# Patient Record
Sex: Female | Born: 1960 | Race: White | Hispanic: No | Marital: Married | Smoking: Never smoker
Health system: Southern US, Community
[De-identification: ages and names within clinical notes are randomized; demographics above are authoritative.]

## PROBLEM LIST (undated history)

## (undated) DIAGNOSIS — E785 Hyperlipidemia, unspecified: Secondary | ICD-10-CM

## (undated) DIAGNOSIS — I10 Essential (primary) hypertension: Secondary | ICD-10-CM

## (undated) HISTORY — PX: BREAST SURGERY: SHX581

## (undated) HISTORY — PX: BREAST BIOPSY: SHX20

## (undated) HISTORY — PX: CHOLECYSTECTOMY: SHX55

## (undated) HISTORY — DX: Essential (primary) hypertension: I10

## (undated) HISTORY — DX: Hyperlipidemia, unspecified: E78.5

---

## 2006-10-01 ENCOUNTER — Ambulatory Visit: Payer: Self-pay | Admitting: Family Medicine

## 2007-08-25 ENCOUNTER — Ambulatory Visit: Payer: Self-pay | Admitting: Family Medicine

## 2007-10-12 ENCOUNTER — Ambulatory Visit: Payer: Self-pay | Admitting: Family Medicine

## 2008-10-18 ENCOUNTER — Ambulatory Visit: Payer: Self-pay | Admitting: Family Medicine

## 2008-11-24 IMAGING — US US PELV - US TRANSVAGINAL
1 series · 14 of 25 positions shown · non-contrast
Comparison: none

REASON FOR EXAM: Immobile uterus
COMMENTS:

[Series 1: us pelv - us transvaginal · 0.37mm/px · 14 of 39 slices shown]
[im 1/39]
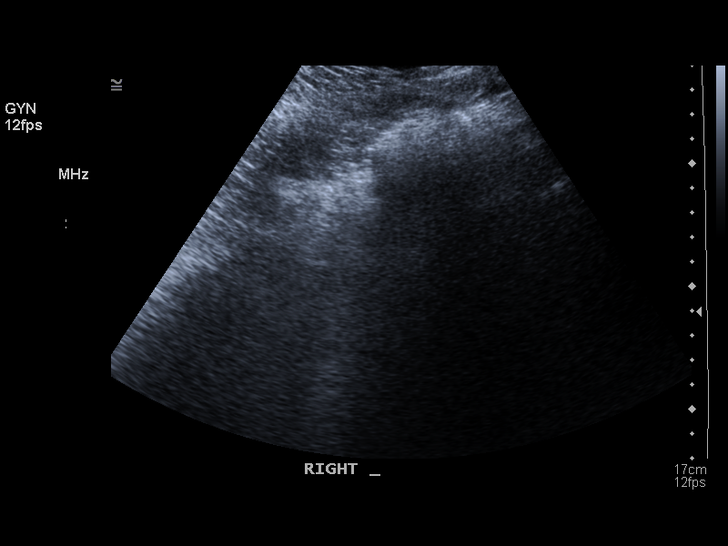
[im 4/39]
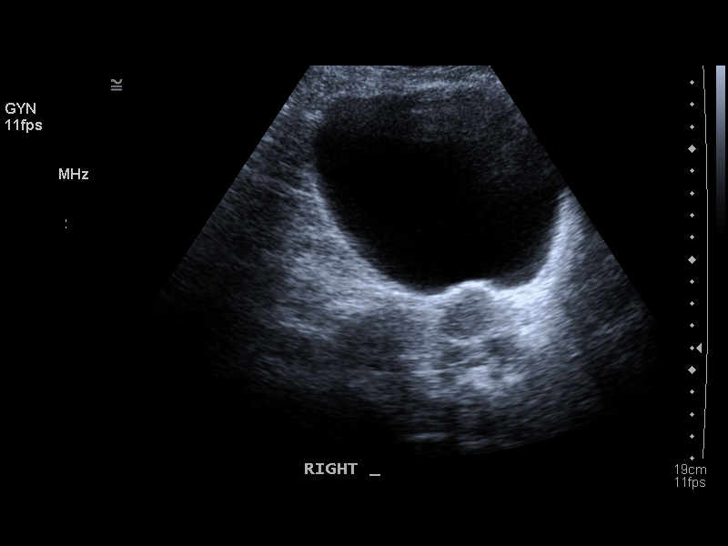
[im 7/39]
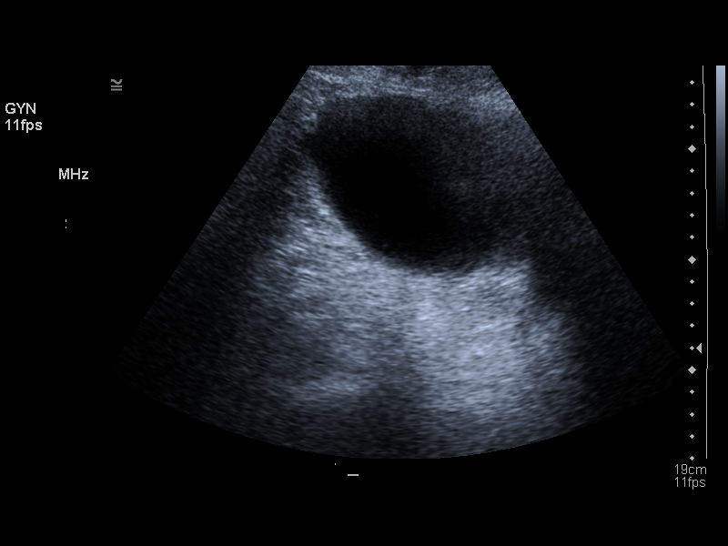
[im 10/39]
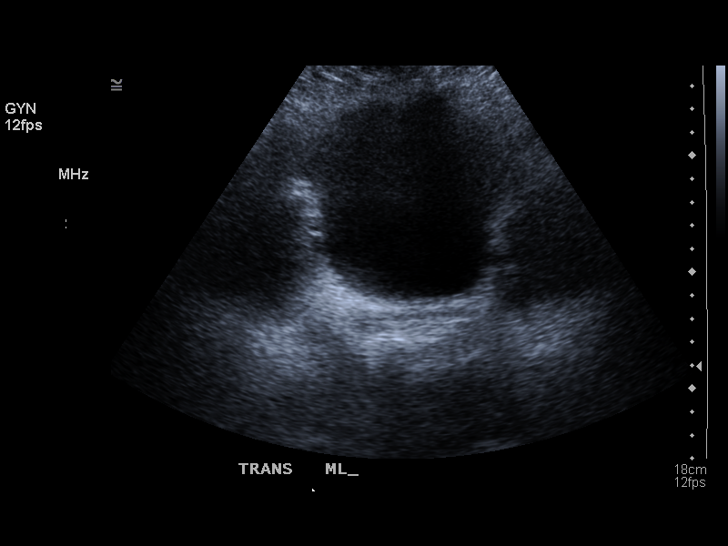
[im 13/39]
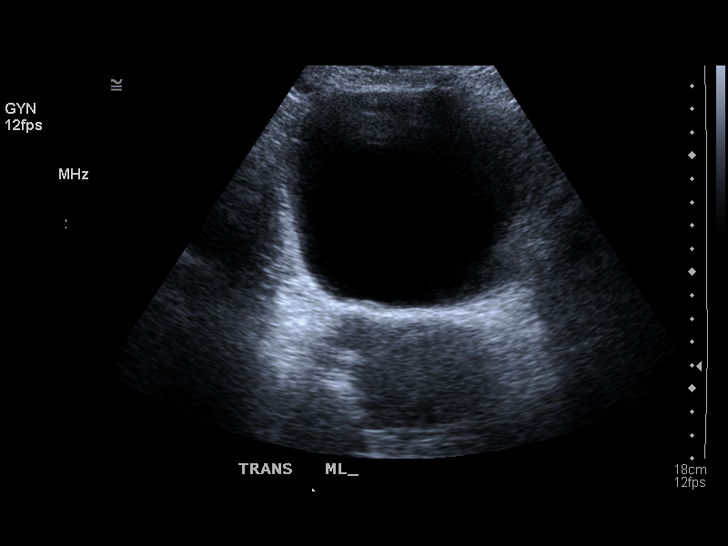
[im 15/39]
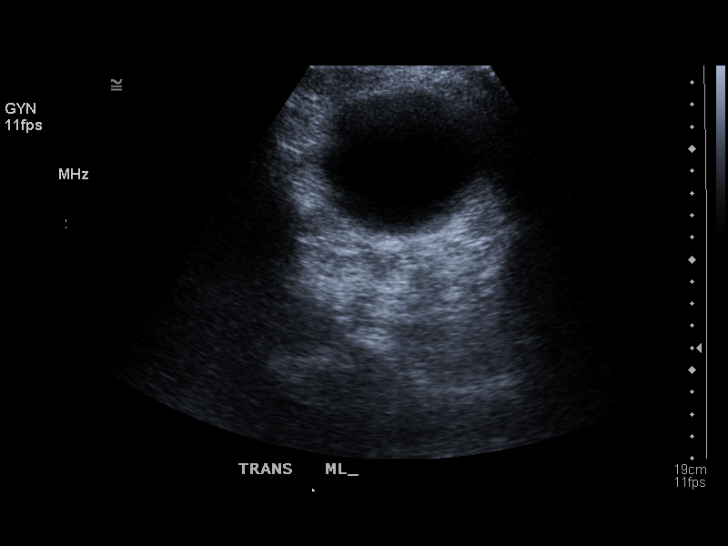
[im 18/39]
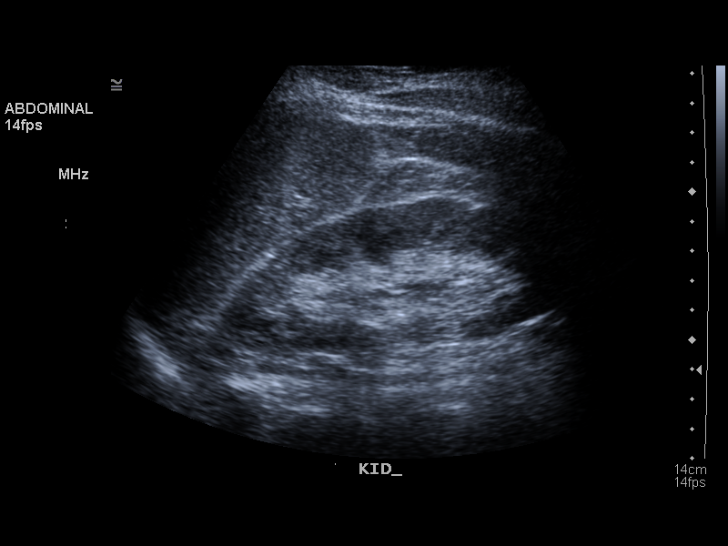
[im 21/39]
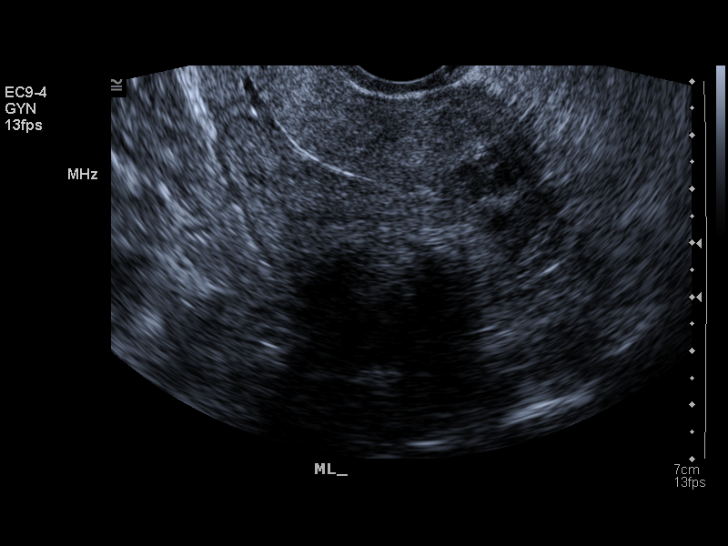
[im 24/39]
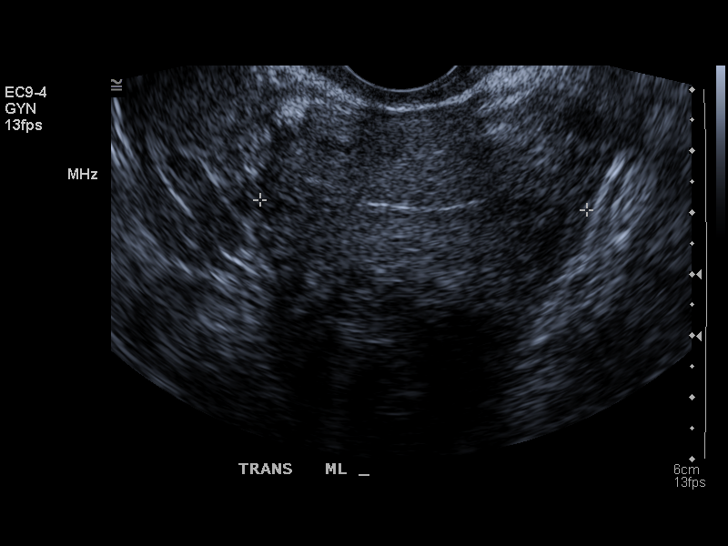
[im 26/39]
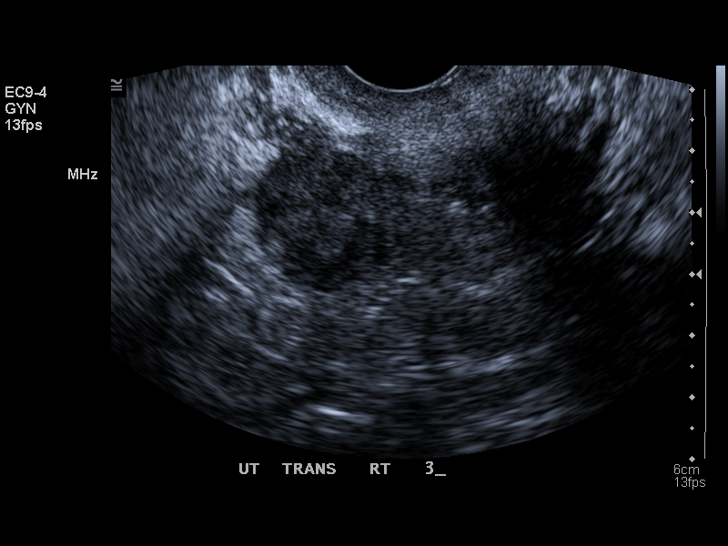
[im 29/39]
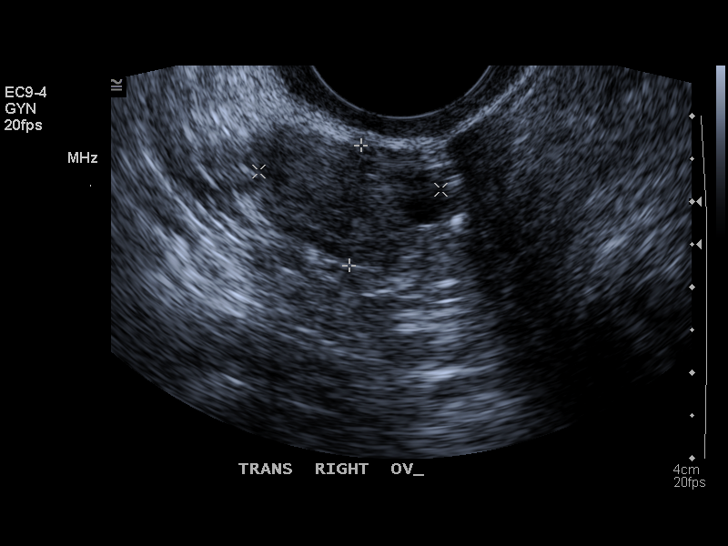
[im 32/39]
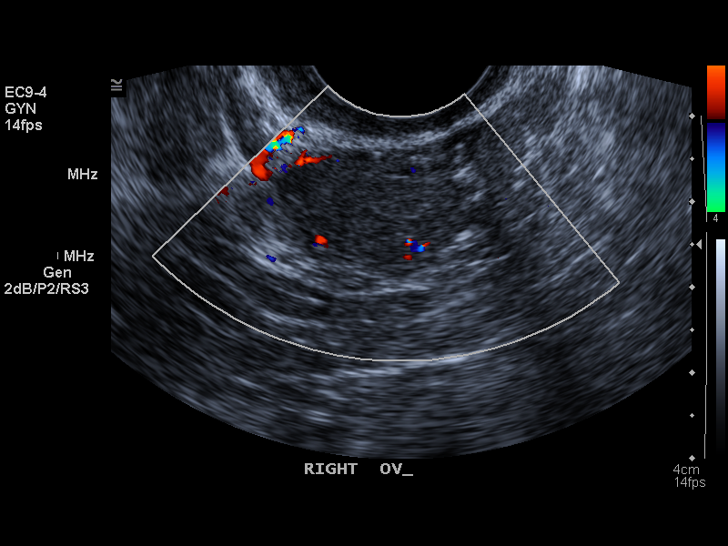
[im 35/39]
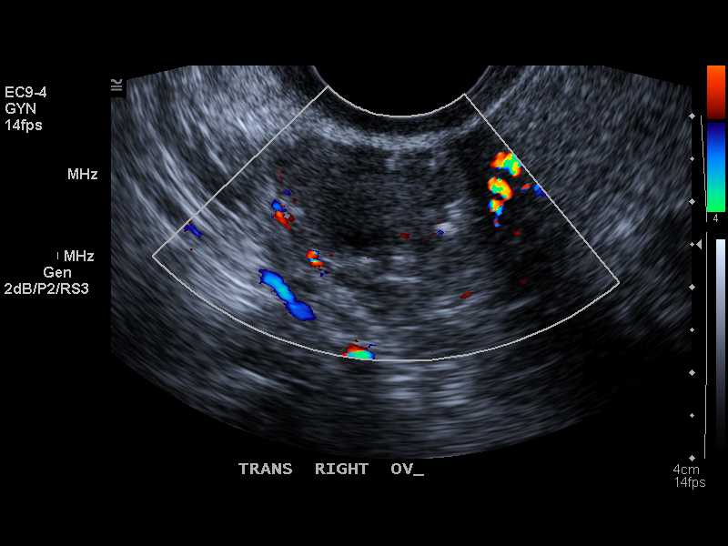
[im 39/39]
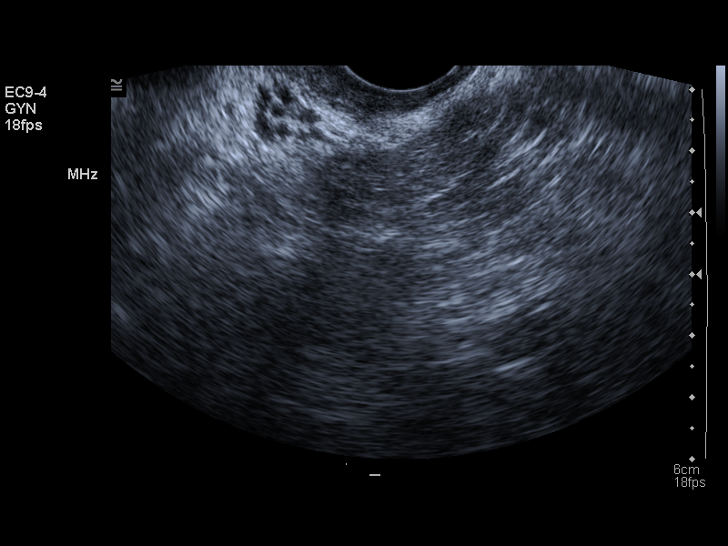

[14 of 25 positions shown; findings below may reference images not displayed]

PROCEDURE:     US  - US PELVIS MASS EXAM  - [DATE]  [DATE] [DATE]  [DATE]

RESULT:     The uterus measures 8.64 x 4.77 x 5.31 cm. Three, heterogeneous
masses are identified within the uterus with the largest measuring 4.08 x
4.92 x 3.61 cm indicative of leiomyomata.

Endometrial thickness is 2.5 mm. There is no evidence of pelvic free fluid
or drainable loculated fluid collections. The LEFT ovary is not visualized.
The RIGHT ovary measures 2.86 x 1.41 x 2.13 cm. A small follicle versus cyst
is demonstrated within the periphery of the RIGHT ovary. Small focal
punctate echogenic foci are demonstrated within the RIGHT ovary possibly
representing small calcifications. No further pelvic abnormalities are
appreciated.
IMPRESSION: 1.  Likely cysts as well as possible small calcifications within the RIGHT
ovary.
2.  Leiomyomata involving the uterus.

## 2009-10-19 ENCOUNTER — Ambulatory Visit: Payer: Self-pay | Admitting: Family Medicine

## 2022-09-17 ENCOUNTER — Other Ambulatory Visit: Payer: Self-pay | Admitting: Internal Medicine

## 2022-09-17 ENCOUNTER — Ambulatory Visit: Payer: BC Managed Care – PPO | Admitting: Internal Medicine

## 2022-09-17 ENCOUNTER — Encounter: Payer: Self-pay | Admitting: Internal Medicine

## 2022-09-17 VITALS — BP 120/78 | HR 96 | Temp 98.5°F | Resp 18 | Ht 65.0 in | Wt 181.5 lb

## 2022-09-17 DIAGNOSIS — I10 Essential (primary) hypertension: Secondary | ICD-10-CM | POA: Diagnosis not present

## 2022-09-17 DIAGNOSIS — Z1231 Encounter for screening mammogram for malignant neoplasm of breast: Secondary | ICD-10-CM

## 2022-09-17 DIAGNOSIS — E782 Mixed hyperlipidemia: Secondary | ICD-10-CM | POA: Diagnosis not present

## 2022-09-17 DIAGNOSIS — N952 Postmenopausal atrophic vaginitis: Secondary | ICD-10-CM

## 2022-09-17 DIAGNOSIS — M62838 Other muscle spasm: Secondary | ICD-10-CM | POA: Diagnosis not present

## 2022-09-17 MED ORDER — LISINOPRIL-HYDROCHLOROTHIAZIDE 20-25 MG PO TABS: 1.0000 | ORAL_TABLET | Freq: Every day | ORAL | 1 refills | Status: DC

## 2022-09-17 MED ORDER — METHOCARBAMOL 750 MG PO TABS: 750.0000 mg | ORAL_TABLET | Freq: Three times a day (TID) | ORAL | 1 refills | Status: DC | PRN

## 2022-09-17 MED ORDER — ESTROGENS CONJUGATED 0.625 MG/GM VA CREA: 1.0000 | TOPICAL_CREAM | Freq: Every day | VAGINAL | 0 refills | Status: DC

## 2022-09-17 NOTE — Progress Notes (Signed)
New Patient Office Visit  Subjective    Patient ID: April Murphy, female    DOB: 28-Dec-1960  Age: 61 y.o. MRN: [id number]  CC:  Chief Complaint  Patient presents with   Establish Care    HPI April Murphy presents to establish care.  She recently moved here from [place] to be closer to family.  Hypertension: -Medications: Lisinopril-HCTZ 20-25 mg, has been on for a few years  -Patient is compliant with above medications and reports no side effects. -Checking BP at home (average): doesn't check  -Denies any SOB, CP, vision changes, LE edema or symptoms of hypotension  HLD: -Medications: Zocor 10 mg -Patient is compliant with above medications and reports no side effects.  -Last lipid panel: last checked in 6/23 in [facility]  She is also complaining of some vaginal dryness today.  She states she has tried multiple lubrications but sexual intercourse is still very painful for her.  This has been an ongoing issue since she went through menopause a few years ago.  Health Maintenance: -Blood work in [facility] in 6/23 -Mammogram in [facility] 3/23 -Colonoscopy due in 2025  -Pap due   Outpatient Encounter Medications as of 09/17/2022  Medication Sig   conjugated estrogens (PREMARIN) vaginal cream Place 1 Applicatorful vaginally daily.   meloxicam (MOBIC) 7.5 MG tablet Take 7.5 mg by mouth daily.   simvastatin (ZOCOR) 10 MG tablet Take 10 mg by mouth at bedtime.   [DISCONTINUED] lisinopril-hydrochlorothiazide (ZESTORETIC) 20-25 MG tablet Take 1 tablet by mouth at bedtime.   [DISCONTINUED] methocarbamol (ROBAXIN) 750 MG tablet Take 750 mg by mouth every 8 (eight) hours as needed.   lisinopril-hydrochlorothiazide (ZESTORETIC) 20-25 MG tablet Take 1 tablet by mouth at bedtime.   methocarbamol (ROBAXIN) 750 MG tablet Take 1 tablet (750 mg total) by mouth every 8 (eight) hours as needed.   No facility-administered encounter medications on file as of 09/17/2022.    Past  Medical History:  Diagnosis Date   Hyperlipidemia    Hypertension     Past Surgical History:  Procedure Laterality Date   BREAST SURGERY     CESAREAN SECTION     CHOLECYSTECTOMY      Family History  Problem Relation Age of Onset   Hypertension Father    Hypertension Sister    Hyperlipidemia Sister    Hypertension Brother    Hypertension Son     Social History   Socioeconomic History   Marital status: Married    Spouse name: Not on file   Number of children: Not on file   Years of education: Not on file   Highest education level: Not on file  Occupational History   Not on file  Tobacco Use   Smoking status: Never   Smokeless tobacco: Never  Vaping Use   Vaping Use: Never used  Substance and Sexual Activity   Alcohol use: Never   Drug use: Not on file   Sexual activity: Yes  Other Topics Concern   Not on file  Social History Narrative   Not on file   Social Determinants of Health   Financial Resource Strain: Not on file  Food Insecurity: Not on file  Transportation Needs: Not on file  Physical Activity: Not on file  Stress: Not on file  Social Connections: Not on file  Intimate Partner Violence: Not on file    Review of Systems  Constitutional:  Negative for chills and fever.  Eyes:  Negative for blurred vision.  Respiratory:  Negative for shortness of breath.   Cardiovascular:  Negative for chest pain.  Neurological:  Negative for headaches.  All other systems reviewed and are negative.      Objective    BP 120/78   Pulse 96   Temp 98.5 F (36.9 C)   Resp 18   Ht 5\' 5"  (1.651 m)   Wt 181 lb 8 oz (82.3 kg)   SpO2 97%   BMI 30.20 kg/m   Physical Exam Constitutional:      Appearance: Normal appearance.  HENT:     Head: Normocephalic and atraumatic.     Mouth/Throat:     Mouth: Mucous membranes are moist.     Pharynx: Oropharynx is clear.  Eyes:     Extraocular Movements: Extraocular movements intact.     Conjunctiva/sclera:  Conjunctivae normal.     Pupils: Pupils are equal, round, and reactive to light.  Cardiovascular:     Rate and Rhythm: Normal rate and regular rhythm.  Pulmonary:     Effort: Pulmonary effort is normal.     Breath sounds: Normal breath sounds.  Musculoskeletal:     Right lower leg: No edema.     Left lower leg: No edema.  Lymphadenopathy:     Cervical: No cervical adenopathy.  Skin:    General: Skin is warm and dry.  Neurological:     General: No focal deficit present.     Mental Status: She is alert. Mental status is at baseline.  Psychiatric:        Mood and Affect: Mood normal.        Behavior: Behavior normal.         Assessment & Plan:   1. Primary hypertension: Chronic and stable.  Blood pressure at goal here today.  Continue lisinopril-HCTZ 20-25 mg daily, refilled today.  Follow-up in 6 months for recheck.  - lisinopril-hydrochlorothiazide (ZESTORETIC) 20-25 MG tablet; Take 1 tablet by mouth at bedtime.  Dispense: 90 tablet; Refill: 1  2. Neck muscle spasm: Occurs occasionally, uses Robaxin just as needed.  Refilled today.  - methocarbamol (ROBAXIN) 750 MG tablet; Take 1 tablet (750 mg total) by mouth every 8 (eight) hours as needed.  Dispense: 90 tablet; Refill: 1  3. Mixed hyperlipidemia: Chronic, just had labs in June with her PCP in July, obtaining results.  Continue Zocor 10 mg daily.  4. Vaginal atrophy: Vaginal estrogen sent to pharmacy for the patient to try.  Counseled on potential side effects.  Literature about the medication provided to the patient.  - conjugated estrogens (PREMARIN) vaginal cream; Place 1 Applicatorful vaginally daily.  Dispense: 42.5 g; Refill: 0  5. Encounter for screening mammogram for malignant neoplasm of breast: Mammogram will be due in March, ordered.  - MM Digital Screening; Future  Return in about 6 months (around 03/18/2023).   03/20/2023, DO

## 2022-09-17 NOTE — Patient Instructions (Addendum)
It was great seeing you today!  Plan discussed at today's visit: -Medications refilled  -Mammogram ordered for March   Follow up in: 6 months   Take care and let us know if you have any questions or concerns prior to your next visit.  Dr. Caralee Ates  Conjugated Estrogens Vaginal Cream What is this medication? CONJUGATED ESTROGENS (CON ju gate ed ESS troe jenz) relieves the symptoms of menopause, such as vaginal irritation, dryness, or pain during sex. It works by increasing levels of the hormone estrogen in the body. This medication is an estrogen hormone. This medicine may be used for other purposes; ask your health care provider or pharmacist if you have questions. COMMON BRAND NAME(S): Premarin What should I tell my care team before I take this medication? They need to know if you have any of these conditions: Abnormal vaginal bleeding Blood vessel disease or blood clots Breast, cervical, endometrial, or uterine cancer Dementia Diabetes Gallbladder disease Heart disease or recent heart attack High blood pressure High cholesterol High level of calcium in the blood Hysterectomy Kidney disease Liver disease Migraine headaches Protein C deficiency Protein S deficiency Stroke Systemic lupus erythematosus (SLE) Tobacco use An unusual or allergic reaction to estrogens other medications, foods, dyes, or preservatives Pregnant or trying to get pregnant Breast-feeding How should I use this medication? This medication is for use in the vagina only. Do not take by mouth. Follow the directions on the prescription label. Use at bedtime unless otherwise directed by your care team. Use the special applicator supplied with the cream. Wash hands before and after use. Fill the applicator with the cream and remove from the tube. Lie on your back, part and bend your knees. Insert the applicator into the vagina and push the plunger to expel the cream into the vagina. Wash the applicator with warm  soapy water and rinse well. Use exactly as directed for the complete length of time prescribed. Do not stop using except on the advice of your care team. Talk to your care team about the use of this medication in children. Special care may be needed. A patient package insert for the product will be given with each prescription and refill. Read this sheet carefully each time. The sheet may change frequently. Overdosage: If you think you have taken too much of this medicine contact a poison control center or emergency room at once. NOTE: This medicine is only for you. Do not share this medicine with others. What if I miss a dose? If you miss a dose, use it as soon as you can. If it is almost time for your next dose, use only that dose. Do not use double or extra doses. What may interact with this medication? Do not take this medication with any of the following: Aromatase inhibitors, such as aminoglutethimide, anastrozole, exemestane, letrozole, testolactone This medication may also interact with the following: Barbiturates used for inducing sleep or treating seizures Carbamazepine Certain antibiotics used to treat infections Grapefruit juice Medications for fungal infections, such as itraconazole and ketoconazole Raloxifene or tamoxifen Rifabutin Rifampin Rifapentine Ritonavir St. John's Wort Warfarin This list may not describe all possible interactions. Give your health care provider a list of all the medicines, herbs, non-prescription drugs, or dietary supplements you use. Also tell them if you smoke, drink alcohol, or use illegal drugs. Some items may interact with your medicine. What should I watch for while using this medication? Visit your care team for regular checks on your progress. You will need  a regular breast and pelvic exam. You should also discuss the need for regular mammograms with your care team, and follow their guidelines. This medication can make your body retain fluid,  making your fingers, hands, or ankles swell. Your blood pressure can go up. Contact your care team if you feel you are retaining fluid. If you have any reason to think you are pregnant; stop taking this medication at once and contact your care team. Tobacco smoking increases the risk of getting a blood clot or having a stroke, especially if you are more than 62 years old. You are strongly advised not to smoke. If you wear contact lenses and notice visual changes, or if the lenses begin to feel uncomfortable, consult your care team. If you are going to have elective surgery, you may need to stop taking this medication beforehand. Consult your care team for advice prior to scheduling the surgery. What side effects may I notice from receiving this medication? Side effects that you should report to your care team as soon as possible: Allergic reactions or angioedema--skin rash, itching or hives, swelling of the face, eyes, lips, tongue, arms, or legs, trouble swallowing or breathing Blood clot--pain, swelling, or warmth in the leg, shortness of breath, chest pain Breast tissue changes, new lumps, redness, pain, or discharge from the nipple Gallbladder problems--severe stomach pain, nausea, vomiting, fever Heart attack--pain or tightness in the chest, shoulders, arms, or jaw, nausea, shortness of breath, cold or clammy skin, feeling faint or lightheaded Heavy vaginal bleeding Increase in blood pressure Stroke--sudden numbness or weakness of the face, arm, or leg, trouble speaking, confusion, trouble walking, loss of balance or coordination, dizziness, severe headache, change in vision Sudden eye pain or change in vision such as blurry vision, seeing halos around lights, vision loss Unusual vaginal discharge, itching, or odor Side effects that usually do not require medical attention (report to your care team if they continue or are bothersome): Bloating Breast pain or tenderness Dark patches of skin  on the face or other sun-exposed areas Hair loss Irregular menstrual cycles or spotting Nausea Stomach pain Swelling of the ankles, hands, or feet This list may not describe all possible side effects. Call your doctor for medical advice about side effects. You may report side effects to FDA at 1-800-FDA-1088. Where should I keep my medication? Keep out of the reach of children and pets. Store at room temperature between 15 and 30 degrees C (59 and 86 degrees F). Throw away any unused medication after the expiration date. NOTE: This sheet is a summary. It may not cover all possible information. If you have questions about this medicine, talk to your doctor, pharmacist, or health care provider.  2023 Elsevier/Gold Standard (2007-11-28 00:00:00)

## 2022-09-18 ENCOUNTER — Other Ambulatory Visit: Payer: Self-pay | Admitting: Internal Medicine

## 2022-09-18 DIAGNOSIS — N952 Postmenopausal atrophic vaginitis: Secondary | ICD-10-CM

## 2022-09-18 MED ORDER — ESTRADIOL 0.1 MG/GM VA CREA: 1.0000 | TOPICAL_CREAM | Freq: Every day | VAGINAL | 12 refills | Status: AC

## 2022-09-18 NOTE — Telephone Encounter (Signed)
Requested medication (s) are due for refill today: no  Requested medication (s) are on the active medication list: yes    Last refill: 09/17/22  42.5g  0 refills  Future visit scheduled yes 03/18/23  Notes to clinic:Pharmacy requesting alternative. Please review. All Pharmacy Suggested Alternatives:  estradiol (ESTRACE) 0.1 MG/GM vaginal cream Estradiol (VAGIFEM) 10 MCG TABS vaginal tablet Estradiol (IMVEXXY MAINTENANCE PACK) 10 MCG INST  To prescribe one of the alternatives listed above, open the encounter and click Re    Requested Prescriptions  Pending Prescriptions Disp Refills   estradiol (ESTRACE) 0.1 MG/GM vaginal cream [Pharmacy Med Name: ESTRADIOL 0.01% CREAM]  0     OB/GYN:  Estrogens Failed - 09/17/2022  3:56 PM      Failed - Mammogram is up-to-date per Health Maintenance      Passed - Last BP in normal range    BP Readings from Last 1 Encounters:  09/17/22 120/78         Passed - Valid encounter within last 12 months    Recent Outpatient Visits           Yesterday Primary hypertension   [facility] Margarita Mail, DO       Future Appointments             In 6 months Margarita Mail, DO [facility]

## 2022-10-18 ENCOUNTER — Telehealth (INDEPENDENT_AMBULATORY_CARE_PROVIDER_SITE_OTHER): Payer: BC Managed Care – PPO | Admitting: Family Medicine

## 2022-10-18 DIAGNOSIS — J329 Chronic sinusitis, unspecified: Secondary | ICD-10-CM

## 2022-10-18 MED ORDER — LORATADINE 10 MG PO TABS: 10.0000 mg | ORAL_TABLET | Freq: Every day | ORAL | 11 refills | Status: DC

## 2022-10-18 MED ORDER — AMOXICILLIN-POT CLAVULANATE 875-125 MG PO TABS: 1.0000 | ORAL_TABLET | Freq: Two times a day (BID) | ORAL | 0 refills | Status: AC

## 2022-10-18 MED ORDER — PSEUDOEPHEDRINE HCL ER 120 MG PO TB12: 120.0000 mg | ORAL_TABLET | Freq: Two times a day (BID) | ORAL | 0 refills | Status: DC | PRN

## 2022-10-18 NOTE — Progress Notes (Signed)
Name: April Murphy   MRN: [id number]    DOB: 1961/05/31   Date:10/18/2022       Progress Note  Subjective:    Chief Complaint  Chief Complaint  Patient presents with   Sneezing   Nasal Congestion    X1 week, getting worse. OTC meds not helping.     I connected with  Jimma D Blankenburg on 10/18/22 at  3:00 PM EST by telephone and verified that I am speaking with the correct person using two identifiers.   I discussed the limitations, risks, security and privacy concerns of performing an evaluation and management service by telephone and the availability of in person appointments. Staff also discussed with the patient that there may be a patient responsible charge related to this service.  Patient verbalized understanding and agreed to proceed with encounter. Patient Location: home Provider Location: [facility] Additional Individuals present: none  HPI Sneezing congestion pressure HA off and on, one minute stopped up next minute OTC sinuses antihistamine 3 hours  Not around anyone recently with illness No fever sweats chills No trouble breathing  Benadryl at bedtime for a week Guaf[place]ine  Phenylephrine          Patient Active Problem List   Diagnosis Date Noted   Primary hypertension 09/17/2022   Mixed hyperlipidemia 09/17/2022    Social History   Tobacco Use   Smoking status: Never   Smokeless tobacco: Never  Substance Use Topics   Alcohol use: Never     Current Outpatient Medications:    estradiol (ESTRACE VAGINAL) 0.1 MG/GM vaginal cream, Place 1 Applicatorful vaginally at bedtime., Disp: 42.5 g, Rfl: 12   lisinopril-hydrochlorothiazide (ZESTORETIC) 20-25 MG tablet, Take 1 tablet by mouth at bedtime., Disp: 90 tablet, Rfl: 1   meloxicam (MOBIC) 7.5 MG tablet, Take 7.5 mg by mouth daily., Disp: , Rfl:    methocarbamol (ROBAXIN) 750 MG tablet, Take 1 tablet (750 mg total) by mouth every 8 (eight) hours as needed., Disp: 90 tablet, Rfl: 1   simvastatin (ZOCOR) 10  MG tablet, Take 10 mg by mouth at bedtime., Disp: , Rfl:   No Known Allergies  Chart Review: I personally reviewed active problem list, medication list, allergies, family history, social history, health maintenance, notes from last encounter, lab results, imaging with the patient/caregiver today.   Review of Systems  Constitutional: Negative.   HENT: Negative.    Eyes: Negative.   Respiratory: Negative.    Cardiovascular: Negative.   Gastrointestinal: Negative.   Endocrine: Negative.   Genitourinary: Negative.   Musculoskeletal: Negative.   Skin: Negative.   Allergic/Immunologic: Negative.   Neurological: Negative.   Hematological: Negative.   Psychiatric/Behavioral: Negative.    All other systems reviewed and are negative.    Objective:    Virtual encounter, vitals limited, only able to obtain the following There were no vitals filed for this visit. There is no height or weight on file to calculate BMI. Nursing Note and Vital Signs reviewed.  Physical Exam Vitals and nursing note reviewed.  Neck:     Trachea: Phonation normal.  Pulmonary:     Comments: No audible wheeze or stridor, patient speaking in full and complete sentences Neurological:     Mental Status: She is alert.     PE limited by telephone encounter  No results found for this or any previous visit (from the past 72 hour(s)).  Assessment and Plan:     ICD-10-CM   1. Rhinosinusitis  J32.9 pseudoephedrine (SUDAFED 12 HOUR)  120 MG 12 hr tablet    loratadine (CLARITIN) 10 MG tablet    amoxicillin-clavulanate (AUGMENTIN) 875-125 MG tablet    Encouraged the patient to use decongestants as noted above and antihistamines she has not tried these ingredients with her prior over-the-counter medications at this time she is not having fever or severe increase in sinus pain or pressure I do believe she can do continue conservative and symptomatic measures and the prescription for the antibiotic was sent in in  case she has sudden worsening of sinus facial pain pressure and or new fever She was advised to stop the Sudafed if she starts the antibiotic at this time I do not believe that she currently requires the antibiotic but due to the long holiday weekend it was sent in to be put on hold  -Red flags and when to present for emergency care or RTC including but not limited to new/worsening/un-resolving symptoms,  reviewed with patient at time of visit. Follow up and care instructions discussed and provided in AVS. - I discussed the assessment and treatment plan with the patient. The patient was provided an opportunity to ask questions and all were answered. The patient agreed with the plan and demonstrated an understanding of the instructions.  - The patient was advised to call back or seek an in-person evaluation if the symptoms worsen or if the condition fails to improve as anticipated.  I provided 15 minutes of non-face-to-face time during this encounter.  Danelle Berry, PA-C 10/18/22 3:28 PM

## 2022-12-27 ENCOUNTER — Other Ambulatory Visit: Payer: Self-pay

## 2022-12-27 ENCOUNTER — Other Ambulatory Visit: Payer: Self-pay | Admitting: Internal Medicine

## 2022-12-27 MED ORDER — SIMVASTATIN 10 MG PO TABS
10.0000 mg | ORAL_TABLET | Freq: Every day | ORAL | 1 refills | Status: DC
  Filled 2022-12-27: qty 90, 90d supply, fill #0
  Filled 2023-05-11: qty 90, 90d supply, fill #1

## 2023-03-18 ENCOUNTER — Ambulatory Visit: Payer: BC Managed Care – PPO | Admitting: Internal Medicine

## 2023-03-18 NOTE — Progress Notes (Unsigned)
Established Patient Office Visit  Subjective    Patient ID: April DEPAEPE, female    DOB: Apr 18, 1961  Age: 62 y.o. MRN: [id number]  CC:  No chief complaint on file.   HPI Zalma D Holford presents to follow up on chronic medical conditions.   Hypertension: -Medications: Lisinopril-HCTZ 20-25 mg, has been on for a few years  -Patient is compliant with above medications and reports no side effects. -Checking BP at home (average): doesn't check  -Denies any SOB, CP, vision changes, LE edema or symptoms of hypotension  HLD: -Medications: Zocor 10 mg -Patient is compliant with above medications and reports no side effects.  -Last lipid panel: last checked in 6/23 in [facility]  She is also complaining of some vaginal dryness today.  She states she has tried multiple lubrications but sexual intercourse is still very painful for her.  This has been an ongoing issue since she went through menopause a few years ago.  Health Maintenance: -Blood work due -Mammogram due -Colonoscopy due in 2025  -Pap due   Outpatient Encounter Medications as of 03/19/2023  Medication Sig   estradiol (ESTRACE VAGINAL) 0.1 MG/GM vaginal cream Place 1 Applicatorful vaginally at bedtime.   lisinopril-hydrochlorothiazide (ZESTORETIC) 20-25 MG tablet Take 1 tablet by mouth at bedtime.   loratadine (CLARITIN) 10 MG tablet Take 1 tablet (10 mg total) by mouth daily.   meloxicam (MOBIC) 7.5 MG tablet Take 7.5 mg by mouth daily.   methocarbamol (ROBAXIN) 750 MG tablet Take 1 tablet (750 mg total) by mouth every 8 (eight) hours as needed.   pseudoephedrine (SUDAFED 12 HOUR) 120 MG 12 hr tablet Take 1 tablet (120 mg total) by mouth every 12 (twelve) hours as needed for congestion. Stop if starting antibiotic   simvastatin (ZOCOR) 10 MG tablet Take 1 tablet (10 mg total) by mouth at bedtime.   No facility-administered encounter medications on file as of 03/19/2023.    Past Medical History:  Diagnosis Date    Hyperlipidemia    Hypertension     Past Surgical History:  Procedure Laterality Date   BREAST SURGERY     CESAREAN SECTION     CHOLECYSTECTOMY      Family History  Problem Relation Age of Onset   Hypertension Father    Hypertension Sister    Hyperlipidemia Sister    Hypertension Brother    Hypertension Son     Social History   Socioeconomic History   Marital status: Married    Spouse name: Not on file   Number of children: Not on file   Years of education: Not on file   Highest education level: Not on file  Occupational History   Not on file  Tobacco Use   Smoking status: Never   Smokeless tobacco: Never  Vaping Use   Vaping Use: Never used  Substance and Sexual Activity   Alcohol use: Never   Drug use: Not on file   Sexual activity: Yes  Other Topics Concern   Not on file  Social History Narrative   Not on file   Social Determinants of Health   Financial Resource Strain: Not on file  Food Insecurity: Not on file  Transportation Needs: Not on file  Physical Activity: Not on file  Stress: Not on file  Social Connections: Not on file  Intimate Partner Violence: Not on file    Review of Systems  Constitutional:  Negative for chills and fever.  Eyes:  Negative for blurred vision.  Respiratory:  Negative for shortness of breath.   Cardiovascular:  Negative for chest pain.  Neurological:  Negative for headaches.  All other systems reviewed and are negative.      Objective    There were no vitals taken for this visit.  Physical Exam Constitutional:      Appearance: Normal appearance.  HENT:     Head: Normocephalic and atraumatic.     Mouth/Throat:     Mouth: Mucous membranes are moist.     Pharynx: Oropharynx is clear.  Eyes:     Extraocular Movements: Extraocular movements intact.     Conjunctiva/sclera: Conjunctivae normal.     Pupils: Pupils are equal, round, and reactive to light.  Cardiovascular:     Rate and Rhythm: Normal rate and  regular rhythm.  Pulmonary:     Effort: Pulmonary effort is normal.     Breath sounds: Normal breath sounds.  Musculoskeletal:     Right lower leg: No edema.     Left lower leg: No edema.  Lymphadenopathy:     Cervical: No cervical adenopathy.  Skin:    General: Skin is warm and dry.  Neurological:     General: No focal deficit present.     Mental Status: She is alert. Mental status is at baseline.  Psychiatric:        Mood and Affect: Mood normal.        Behavior: Behavior normal.         Assessment & Plan:   1. Primary hypertension: Chronic and stable.  Blood pressure at goal here today.  Continue lisinopril-HCTZ 20-25 mg daily, refilled today.  Follow-up in 6 months for recheck.  - lisinopril-hydrochlorothiazide (ZESTORETIC) 20-25 MG tablet; Take 1 tablet by mouth at bedtime.  Dispense: 90 tablet; Refill: 1  2. Neck muscle spasm: Occurs occasionally, uses Robaxin just as needed.  Refilled today.  - methocarbamol (ROBAXIN) 750 MG tablet; Take 1 tablet (750 mg total) by mouth every 8 (eight) hours as needed.  Dispense: 90 tablet; Refill: 1  3. Mixed hyperlipidemia: Chronic, just had labs in June with her PCP in [place], obtaining results.  Continue Zocor 10 mg daily.  4. Vaginal atrophy: Vaginal estrogen sent to pharmacy for the patient to try.  Counseled on potential side effects.  Literature about the medication provided to the patient.  - conjugated estrogens (PREMARIN) vaginal cream; Place 1 Applicatorful vaginally daily.  Dispense: 42.5 g; Refill: 0  5. Encounter for screening mammogram for malignant neoplasm of breast: Mammogram will be due in March, ordered.  - MM Digital Screening; Future  No follow-ups on file.   Margarita Mail, DO

## 2023-03-19 ENCOUNTER — Ambulatory Visit: Payer: BC Managed Care – PPO | Admitting: Internal Medicine

## 2023-03-19 ENCOUNTER — Other Ambulatory Visit: Payer: Self-pay | Admitting: Internal Medicine

## 2023-03-19 ENCOUNTER — Encounter: Payer: Self-pay | Admitting: Internal Medicine

## 2023-03-19 VITALS — BP 124/80 | HR 95 | Temp 98.0°F | Resp 16 | Ht 65.0 in | Wt 175.3 lb

## 2023-03-19 DIAGNOSIS — Z1159 Encounter for screening for other viral diseases: Secondary | ICD-10-CM

## 2023-03-19 DIAGNOSIS — I10 Essential (primary) hypertension: Secondary | ICD-10-CM | POA: Diagnosis not present

## 2023-03-19 DIAGNOSIS — N952 Postmenopausal atrophic vaginitis: Secondary | ICD-10-CM | POA: Diagnosis not present

## 2023-03-19 DIAGNOSIS — E782 Mixed hyperlipidemia: Secondary | ICD-10-CM | POA: Diagnosis not present

## 2023-03-19 DIAGNOSIS — Z114 Encounter for screening for human immunodeficiency virus [HIV]: Secondary | ICD-10-CM

## 2023-03-19 DIAGNOSIS — Z1231 Encounter for screening mammogram for malignant neoplasm of breast: Secondary | ICD-10-CM

## 2023-03-19 DIAGNOSIS — Z9109 Other allergy status, other than to drugs and biological substances: Secondary | ICD-10-CM | POA: Insufficient documentation

## 2023-03-19 MED ORDER — LISINOPRIL-HYDROCHLOROTHIAZIDE 20-25 MG PO TABS: 1.0000 | ORAL_TABLET | Freq: Every day | ORAL | 1 refills | Status: DC

## 2023-03-19 MED ORDER — CETIRIZINE HCL 10 MG PO TABS
10.0000 mg | ORAL_TABLET | Freq: Every day | ORAL | 11 refills | Status: AC
Start: 2023-03-19 — End: ?

## 2023-03-19 NOTE — Telephone Encounter (Signed)
Unable to refill per protocol, rx request was refilled 03/19/23 for 90 days.  Requested Prescriptions  Pending Prescriptions Disp Refills   lisinopril-hydrochlorothiazide (ZESTORETIC) 20-25 MG tablet [Pharmacy Med Name: LISINOPRIL-HCTZ 20-25 MG TAB] 90 tablet 1    Sig: TAKE 1 TABLET BY MOUTH EVERYDAY AT BEDTIME     Cardiovascular:  ACEI + Diuretic Combos Failed - 03/19/2023  2:37 AM      Failed - Na in normal range and within 180 days    No results found for: "NA", "POCNA"       Failed - K in normal range and within 180 days    No results found for: "K", "POTASSIUM", "POCK"       Failed - Cr in normal range and within 180 days    No results found for: "CREATININE", "LABCREAU", "LABCREA", "POCCRE"       Failed - eGFR is 30 or above and within 180 days    No results found for: "GFRAA", "GFRNONAA", "GFR", "EGFR"       Failed - Valid encounter within last 6 months    Recent Outpatient Visits           Today Primary hypertension   [facility] Margarita Mail, DO   5 months ago Rhinosinusitis   [facility] Danelle Berry, PA-C   6 months ago Primary hypertension   [facility] Margarita Mail, DO       Future Appointments             In 6 months Margarita Mail, DO [place] [facility], [facility]            Passed - Patient is not pregnant      Passed - Last BP in normal range    BP Readings from Last 1 Encounters:  03/19/23 124/80

## 2023-03-21 ENCOUNTER — Other Ambulatory Visit: Payer: Self-pay | Admitting: Internal Medicine

## 2023-03-21 DIAGNOSIS — E781 Pure hyperglyceridemia: Secondary | ICD-10-CM

## 2023-03-21 LAB — COMPLETE METABOLIC PANEL WITH GFR
AG Ratio: 2 (calc) (ref 1.0–2.5)
ALT: 22 U/L (ref 6–29)
AST: 17 U/L (ref 10–35)
Albumin: 4.4 g/dL (ref 3.6–5.1)
Alkaline phosphatase (APISO): 74 U/L (ref 37–153)
BUN: 25 mg/dL (ref 7–25)
CO2: 30 mmol/L (ref 20–32)
Calcium: 9.4 mg/dL (ref 8.6–10.4)
Chloride: 103 mmol/L (ref 98–110)
Creat: 0.71 mg/dL (ref 0.50–1.05)
Globulin: 2.2 g/dL (calc) (ref 1.9–3.7)
Glucose, Bld: 116 mg/dL — ABNORMAL HIGH (ref 65–99)
Potassium: 4.7 mmol/L (ref 3.5–5.3)
Sodium: 140 mmol/L (ref 135–146)
Total Bilirubin: 0.4 mg/dL (ref 0.2–1.2)
Total Protein: 6.6 g/dL (ref 6.1–8.1)
eGFR: 95 mL/min/{1.73_m2} (ref >=60)

## 2023-03-21 LAB — CBC WITH DIFFERENTIAL/PLATELET
Absolute Monocytes: 392 cells/uL (ref 200–950)
Basophils Absolute: 62 cells/uL (ref 0–200)
Basophils Relative: 1.1 %
Eosinophils Absolute: 263 cells/uL (ref 15–500)
Eosinophils Relative: 4.7 %
HCT: 37.7 % (ref 35.0–45.0)
Hemoglobin: 12.7 g/dL (ref 11.7–15.5)
Lymphs Abs: 2010 cells/uL (ref 850–3900)
MCH: 27.4 pg (ref 27.0–33.0)
MCHC: 33.7 g/dL (ref 32.0–36.0)
MCV: 81.4 fL (ref 80.0–100.0)
MPV: 12 fL (ref 7.5–12.5)
Monocytes Relative: 7 %
Neutro Abs: 2873 cells/uL (ref 1500–7800)
Neutrophils Relative %: 51.3 %
Platelets: 181 10*3/uL (ref 140–400)
RBC: 4.63 10*6/uL (ref 3.80–5.10)
RDW: 14.1 % (ref 11.0–15.0)
Total Lymphocyte: 35.9 %
WBC: 5.6 10*3/uL (ref 3.8–10.8)

## 2023-03-21 LAB — LIPID PANEL
Cholesterol: 158 mg/dL (ref <200)
HDL: 30 mg/dL — ABNORMAL LOW (ref >=50)
LDL Cholesterol (Calc): 83 mg/dL (calc)
Non-HDL Cholesterol (Calc): 128 mg/dL (calc) (ref <130)
Total CHOL/HDL Ratio: 5.3 (calc) — ABNORMAL HIGH (ref <5.0)
Triglycerides: 373 mg/dL — ABNORMAL HIGH (ref <150)

## 2023-03-21 LAB — HIV ANTIBODY (ROUTINE TESTING W REFLEX): HIV 1&2 Ab, 4th Generation: NONREACTIVE

## 2023-03-21 LAB — HEPATITIS C ANTIBODY: Hepatitis C Ab: NONREACTIVE

## 2023-03-21 MED ORDER — FENOFIBRATE 48 MG PO TABS
48.0000 mg | ORAL_TABLET | Freq: Every day | ORAL | 1 refills | Status: AC
Start: 2023-03-21 — End: ?

## 2023-04-02 ENCOUNTER — Other Ambulatory Visit: Payer: Self-pay | Admitting: *Deleted

## 2023-04-02 ENCOUNTER — Inpatient Hospital Stay
Admission: RE | Admit: 2023-04-02 | Discharge: 2023-04-02 | Disposition: A | Discharge status: Home / Self Care | Payer: Self-pay | Source: Ambulatory Visit | Attending: Internal Medicine | Admitting: Internal Medicine

## 2023-04-02 ENCOUNTER — Ambulatory Visit
Admission: RE | Admit: 2023-04-02 | Discharge: 2023-04-02 | Disposition: A | Discharge status: Home / Self Care | Payer: BC Managed Care – PPO | Source: Ambulatory Visit | Attending: Internal Medicine | Admitting: Internal Medicine

## 2023-04-02 DIAGNOSIS — Z1231 Encounter for screening mammogram for malignant neoplasm of breast: Secondary | ICD-10-CM

## 2023-04-18 ENCOUNTER — Other Ambulatory Visit: Payer: Self-pay | Admitting: Internal Medicine

## 2023-04-18 DIAGNOSIS — M62838 Other muscle spasm: Secondary | ICD-10-CM

## 2023-04-21 ENCOUNTER — Other Ambulatory Visit: Payer: Self-pay

## 2023-04-21 DIAGNOSIS — M62838 Other muscle spasm: Secondary | ICD-10-CM

## 2023-04-21 MED ORDER — METHOCARBAMOL 750 MG PO TABS: 750.0000 mg | ORAL_TABLET | Freq: Three times a day (TID) | ORAL | 1 refills | Status: DC | PRN

## 2023-04-21 NOTE — Telephone Encounter (Signed)
Requested medication (s) are due for refill today: yes  Requested medication (s) are on the active medication list: yes  Last refill:  09/17/22 #90 1 refills  Future visit scheduled: yes in 5 months  Notes to clinic:  not delegated per protocol. Do you want to refill Rx?     Requested Prescriptions  Pending Prescriptions Disp Refills   methocarbamol (ROBAXIN) 750 MG tablet [Pharmacy Med Name: METHOCARBAMOL 750 MG TABLET] 90 tablet 1    Sig: Take 1 tablet (750 mg total) by mouth every 8 (eight) hours as needed.     Not Delegated - Analgesics:  Muscle Relaxants Failed - 04/18/2023  2:47 PM      Failed - This refill cannot be delegated      Passed - Valid encounter within last 6 months    Recent Outpatient Visits           1 month ago Primary hypertension   [facility] Margarita Mail, DO   6 months ago Rhinosinusitis   [facility] Danelle Berry, PA-C   7 months ago Primary hypertension   [facility] Margarita Mail, DO       Future Appointments             In 5 months Margarita Mail, DO [facility], [facility]

## 2023-05-14 ENCOUNTER — Telehealth: Payer: BC Managed Care – PPO | Admitting: Internal Medicine

## 2023-07-20 ENCOUNTER — Other Ambulatory Visit: Payer: Self-pay | Admitting: Internal Medicine

## 2023-07-20 DIAGNOSIS — I10 Essential (primary) hypertension: Secondary | ICD-10-CM

## 2023-07-20 DIAGNOSIS — E781 Pure hyperglyceridemia: Secondary | ICD-10-CM

## 2023-07-21 NOTE — Telephone Encounter (Signed)
Requested Prescriptions  Pending Prescriptions Disp Refills   fenofibrate (TRICOR) 48 MG tablet [Pharmacy Med Name: FENOFIBRATE 48 MG TABLET] 90 tablet 0    Sig: TAKE 1 TABLET BY MOUTH EVERY DAY     Cardiovascular:  Antilipid - Fibric Acid Derivatives Failed - 07/20/2023  5:11 PM      Failed - Lipid Panel in normal range within the last 12 months    Cholesterol  Date Value Ref Range Status  03/20/2023 158 <200 mg/dL Final   LDL Cholesterol (Calc)  Date Value Ref Range Status  03/20/2023 83 mg/dL (calc) Final    Comment:    Reference range: <100 . Desirable range <100 mg/dL for primary prevention;   <70 mg/dL for patients with CHD or diabetic patients  with > or = 2 CHD risk factors. Marland Kitchen LDL-C is now calculated using the Martin-Hopkins  calculation, which is a validated novel method providing  better accuracy than the Friedewald equation in the  estimation of LDL-C.  Horald Pollen et al. Lenox Ahr. 4098;119(14): 2061-2068  (http://education.[employer].com/faq/FAQ164)    HDL  Date Value Ref Range Status  03/20/2023 30 (L) > OR = 50 mg/dL Final   Triglycerides  Date Value Ref Range Status  03/20/2023 373 (H) <150 mg/dL Final    Comment:    . If a non-fasting specimen was collected, consider repeat triglyceride testing on a fasting specimen if clinically indicated.  Perry Mount et al. J. of Clin. Lipidol. 2015;9:129-169. Marland Kitchen          Passed - ALT in normal range and within 360 days    ALT  Date Value Ref Range Status  03/20/2023 22 6 - 29 U/L Final         Passed - AST in normal range and within 360 days    AST  Date Value Ref Range Status  03/20/2023 17 10 - 35 U/L Final         Passed - Cr in normal range and within 360 days    Creat  Date Value Ref Range Status  03/20/2023 0.71 0.50 - 1.05 mg/dL Final         Passed - HGB in normal range and within 360 days    Hemoglobin  Date Value Ref Range Status  03/20/2023 12.7 11.7 - 15.5 g/dL Final         Passed -  HCT in normal range and within 360 days    HCT  Date Value Ref Range Status  03/20/2023 37.7 35.0 - 45.0 % Final         Passed - PLT in normal range and within 360 days    Platelets  Date Value Ref Range Status  03/20/2023 181 140 - 400 Thousand/uL Final         Passed - WBC in normal range and within 360 days    WBC  Date Value Ref Range Status  03/20/2023 5.6 3.8 - 10.8 Thousand/uL Final         Passed - eGFR is 30 or above and within 360 days    eGFR  Date Value Ref Range Status  03/20/2023 95 > OR = 60 mL/min/1.54m2 Final         Passed - Valid encounter within last 12 months    Recent Outpatient Visits           4 months ago Primary hypertension   [facility] Margarita Mail, DO   9 months ago Rhinosinusitis   [place]  [facility] Danelle Berry, PA-C   10 months ago Primary hypertension   [facility] Margarita Mail, DO       Future Appointments             In 2 months Margarita Mail, DO [facility]             lisinopril-hydrochlorothiazide (ZESTORETIC) 20-25 MG tablet [Pharmacy Med Name: LISINOPRIL-HCTZ 20-25 MG TAB] 90 tablet 0    Sig: TAKE 1 TABLET BY MOUTH EVERYDAY AT BEDTIME     Cardiovascular:  ACEI + Diuretic Combos Passed - 07/20/2023  5:11 PM      Passed - Na in normal range and within 180 days    Sodium  Date Value Ref Range Status  03/20/2023 140 135 - 146 mmol/L Final         Passed - K in normal range and within 180 days    Potassium  Date Value Ref Range Status  03/20/2023 4.7 3.5 - 5.3 mmol/L Final         Passed - Cr in normal range and within 180 days    Creat  Date Value Ref Range Status  03/20/2023 0.71 0.50 - 1.05 mg/dL Final         Passed - eGFR is 30 or above and within 180 days    eGFR  Date Value Ref Range Status  03/20/2023 95 > OR = 60 mL/min/1.22m2 Final         Passed - Patient is not pregnant       Passed - Last BP in normal range    BP Readings from Last 1 Encounters:  03/19/23 124/80         Passed - Valid encounter within last 6 months    Recent Outpatient Visits           4 months ago Primary hypertension   [facility] Margarita Mail, DO   9 months ago Rhinosinusitis   [facility] Danelle Berry, PA-C   10 months ago Primary hypertension   [facility] Margarita Mail, DO       Future Appointments             In 2 months Margarita Mail, DO [facility], [facility]

## 2023-08-27 ENCOUNTER — Other Ambulatory Visit: Payer: Self-pay | Admitting: Internal Medicine

## 2023-08-27 ENCOUNTER — Other Ambulatory Visit: Payer: Self-pay

## 2023-08-27 MED ORDER — MELOXICAM 7.5 MG PO TABS
7.5000 mg | ORAL_TABLET | Freq: Every day | ORAL | 0 refills | Status: DC
  Filled 2023-08-27: qty 30, 30d supply, fill #0

## 2023-09-23 NOTE — Progress Notes (Unsigned)
Established Patient Office Visit  Subjective    Patient ID: April Murphy, female    DOB: 1961-10-13  Age: 62 y.o. MRN: [id number]  CC:  No chief complaint on file.   HPI April Murphy presents to follow up on chronic medical conditions.   Hypertension: -Medications: Lisinopril-HCTZ 20-25 mg, has been on for a few years  -Patient is compliant with above medications and reports no side effects. -Checking BP at home (average): doesn't check  -Denies any SOB, CP, vision changes, LE edema or symptoms of hypotension  HLD: -Medications: Zocor 10 mg -Patient is compliant with above medications and reports no side effects.  -Last lipid panel: last checked in 6/23 in [place]  Vaginal dryness: -Worse with sexual activity after menopause -Was prescribed vaginal estrogen cream at last office visit, patient has not tried it yet, concerned about potential side effects/risks  Environmental allergies: -Exacerbated currently -Patient taking Claritin-D but still having congestion, worse at night.  Also taking over-the-counter nasal spray, most likely nasal steroid but uncertain  Health Maintenance: -Blood work UTD -Mammogram UTD 6/24 Birads-1 -Colonoscopy due in 2025  -Pap due -obtaining records  Outpatient Encounter Medications as of 09/24/2023  Medication Sig   cetirizine (ZYRTEC) 10 MG tablet Take 1 tablet (10 mg total) by mouth daily.   estradiol (ESTRACE VAGINAL) 0.1 MG/GM vaginal cream Place 1 Applicatorful vaginally at bedtime.   fenofibrate (TRICOR) 48 MG tablet TAKE 1 TABLET BY MOUTH EVERY DAY   lisinopril-hydrochlorothiazide (ZESTORETIC) 20-25 MG tablet TAKE 1 TABLET BY MOUTH EVERYDAY AT BEDTIME   meloxicam (MOBIC) 7.5 MG tablet Take 1 tablet (7.5 mg total) by mouth daily.   methocarbamol (ROBAXIN) 750 MG tablet Take 1 tablet (750 mg total) by mouth every 8 (eight) hours as needed.   simvastatin (ZOCOR) 10 MG tablet Take 1 tablet (10 mg total) by mouth at bedtime.    No facility-administered encounter medications on file as of 09/24/2023.    Past Medical History:  Diagnosis Date   Hyperlipidemia    Hypertension     Past Surgical History:  Procedure Laterality Date   BREAST BIOPSY     Since early 59s per pt   BREAST SURGERY     CESAREAN SECTION     CHOLECYSTECTOMY      Family History  Problem Relation Age of Onset   Hypertension Father    Hypertension Sister    Hyperlipidemia Sister    Hypertension Brother    Hypertension Son    Breast cancer Other     Social History   Socioeconomic History   Marital status: Married    Spouse name: Not on file   Number of children: Not on file   Years of education: Not on file   Highest education level: Not on file  Occupational History   Not on file  Tobacco Use   Smoking status: Never   Smokeless tobacco: Never  Vaping Use   Vaping status: Never Used  Substance and Sexual Activity   Alcohol use: Never   Drug use: Not on file   Sexual activity: Yes  Other Topics Concern   Not on file  Social History Narrative   Not on file   Social Determinants of Health   Financial Resource Strain: Not on file  Food Insecurity: Not on file  Transportation Needs: Not on file  Physical Activity: Not on file  Stress: Not on file  Social Connections: Not on file  Intimate Partner Violence: Not on file  Review of Systems  Constitutional:  Negative for chills and fever.  HENT:  Positive for congestion.   Eyes:  Negative for blurred vision.  Respiratory:  Negative for shortness of breath.   Cardiovascular:  Negative for chest pain.  Neurological:  Negative for headaches.  All other systems reviewed and are negative.      Objective    There were no vitals taken for this visit.  Physical Exam Constitutional:      Appearance: Normal appearance.  HENT:     Head: Normocephalic and atraumatic.  Eyes:     Conjunctiva/sclera: Conjunctivae normal.  Cardiovascular:     Rate and Rhythm:  Normal rate and regular rhythm.  Pulmonary:     Effort: Pulmonary effort is normal.     Breath sounds: Normal breath sounds.  Musculoskeletal:     Right lower leg: No edema.     Left lower leg: No edema.  Skin:    General: Skin is warm and dry.  Neurological:     General: No focal deficit present.     Mental Status: She is alert. Mental status is at baseline.  Psychiatric:        Mood and Affect: Mood normal.        Behavior: Behavior normal.         Assessment & Plan:   1. Primary hypertension: Blood pressure well-controlled, continue lisinopril-HCTZ 20-25 mg daily, refilled.  Blood work due.  Follow-up in 6 months.  - CBC w/Diff/Platelet - COMPLETE METABOLIC PANEL WITH GFR - lisinopril-hydrochlorothiazide (ZESTORETIC) 20-25 MG tablet; Take 1 tablet by mouth at bedtime.  Dispense: 90 tablet; Refill: 1  2. Mixed hyperlipidemia: Check fasting cholesterol today.  Continue Zocor 10 mg.  - Lipid Profile  3. Environmental allergies: Stop pseudoephedrine due to high blood pressure, switch from Claritin to Zyrtec.  Patient can take Coricidin as needed for congestion.  Continue nasal steroid.  May need to add Singulair if symptoms continue.  - cetirizine (ZYRTEC) 10 MG tablet; Take 1 tablet (10 mg total) by mouth daily.  Dispense: 30 tablet; Refill: 11  4. Vaginal atrophy: Again discussed risks and benefits of estrogen cream, patient will continue to use vaginal lubrication as well.  5. Need for hepatitis C screening test/Encounter for screening for HIV: Screening due.  - Hepatitis C Antibody - HIV antibody (with reflex)  6. Encounter for screening mammogram for malignant neoplasm of breast: Mammogram ordered.  - MM 3D SCREENING MAMMOGRAM BILATERAL BREAST; Future   No follow-ups on file.   Margarita Mail, DO

## 2023-09-24 ENCOUNTER — Encounter: Payer: Self-pay | Admitting: Internal Medicine

## 2023-09-24 ENCOUNTER — Other Ambulatory Visit: Payer: Self-pay

## 2023-09-24 ENCOUNTER — Ambulatory Visit: Payer: BC Managed Care – PPO | Admitting: Internal Medicine

## 2023-09-24 VITALS — BP 124/76 | HR 99 | Temp 98.8°F | Resp 16 | Ht 65.0 in | Wt 185.0 lb

## 2023-09-24 DIAGNOSIS — Z23 Encounter for immunization: Secondary | ICD-10-CM | POA: Diagnosis not present

## 2023-09-24 DIAGNOSIS — I10 Essential (primary) hypertension: Secondary | ICD-10-CM | POA: Diagnosis not present

## 2023-09-24 DIAGNOSIS — E781 Pure hyperglyceridemia: Secondary | ICD-10-CM | POA: Diagnosis not present

## 2023-09-24 DIAGNOSIS — M1712 Unilateral primary osteoarthritis, left knee: Secondary | ICD-10-CM | POA: Diagnosis not present

## 2023-09-24 DIAGNOSIS — E782 Mixed hyperlipidemia: Secondary | ICD-10-CM | POA: Diagnosis not present

## 2023-09-24 DIAGNOSIS — Z1211 Encounter for screening for malignant neoplasm of colon: Secondary | ICD-10-CM

## 2023-09-24 MED ORDER — SIMVASTATIN 10 MG PO TABS: 10.0000 mg | ORAL_TABLET | Freq: Every day | ORAL | 1 refills | Status: DC

## 2023-09-24 MED ORDER — FENOFIBRATE 48 MG PO TABS: 48.0000 mg | ORAL_TABLET | Freq: Every day | ORAL | 1 refills | Status: DC

## 2023-09-24 MED ORDER — LISINOPRIL-HYDROCHLOROTHIAZIDE 20-25 MG PO TABS: 1.0000 | ORAL_TABLET | Freq: Every day | ORAL | 1 refills | Status: DC

## 2023-09-24 NOTE — Patient Instructions (Signed)
Patellar Tendinitis Rehab Ask your health care provider which exercises are safe for you. Do exercises exactly as told by your health care provider and adjust them as directed. It is normal to feel mild stretching, pulling, tightness, or discomfort as you do these exercises. Stop right away if you feel sudden pain or your pain gets worse. Do not begin these exercises until told by your health care provider. Stretching and range-of-motion exercise This exercise warms up your muscles and joints and improves the movement and flexibility of your knee. The exercise also helps to relieve pain and stiffness. Hamstring, doorway stretch This is an exercise in which you lie in a doorway and prop your leg on a wall to stretch the back of your knee and thigh (hamstring). Lie on your back in front of a doorway with your left / right leg resting against the wall and your other leg flat on the floor in the doorway. There should be a slight bend in your left / right knee. Straighten your left / right knee. You should feel a stretch behind your knee or thigh. If you do not, scoot your buttocks closer to the door. Hold this position for __________ seconds. Repeat __________ times. Complete this exercise __________ times a day. Strengthening exercises These exercises build strength and endurance in your knee. Endurance is the ability to use your muscles for a long time, even after they get tired. Quadriceps, isometric This exercise stretches the muscles in front of your thigh (quadriceps) without moving your knee joint (isometric). Lie on your back with your left / right leg extended and your other knee bent. Slowly tense the muscles in the front of your left / right thigh. When you do this, you should see your kneecap slide up toward your hip or see increased dimpling just above the knee. This motion will push the back of your knee toward the floor. If this is painful, try putting a rolled-up hand towel under your  knee to support it in a bent position. Change the size of the towel to find a position that allows you to do this exercise without any pain. For __________ seconds, hold the muscle as tight as you can without increasing your pain. Relax the muscles slowly and completely. Repeat __________ times. Complete this exercise __________ times a day. Straight leg raises, flexors This exercise stretches the muscles in front of your thigh (quadriceps) and the muscles that move your hips (hip flexors). Lie on your back with your left / right leg extended and your other knee bent. Tense the muscles in the front of your left / right thigh. When you do this, you should see your kneecap slide up or see increased dimpling just above the knee. Keep these muscles tight as you raise your leg 4-6 inches (10-15 cm) off the floor. Do not let your moving knee bend. Hold this position for __________ seconds. Keep these muscles tense as you slowly lower your leg. Relax your muscles slowly and completely. Repeat __________ times. Complete this exercise __________ times a day. Squats This is a weight-bearing exercise in which you bend your knees and lower your hips while engaging your thigh muscles. Stand in front of a table, with your feet and knees pointing straight ahead. You may rest your hands on the table for balance but not for support. Slowly bend your knees and lower your hips like you are going to sit in a chair. Keep your weight over your heels, not over your toes.  Keep your lower legs upright so they are parallel with the table legs. Do not let your hips go lower than your knees. Do not bend lower than told by your health care provider. If your knee pain increases, do not bend as low. Hold the squat position for __________ seconds. Slowly push with your legs to return to standing. Do not use your hands to pull yourself to standing. Repeat __________ times. Complete this exercise __________ times a  day. Step-downs This is an exercise in which you step down slowly while engaging your leg muscles. Stand on the edge of a step. Keeping your weight over your left / right heel, slowly bend your left / right knee to bring your left / right heel toward the floor. Lower your heel as far as you can while keeping control and without increasing any discomfort. Do not let your left / right knee come forward. Use your leg muscles, not gravity, to lower your body. Hold on to a wall or rail for balance if needed. Slowly push through your heel to lift your body weight back up. Return to the starting position. Repeat __________ times. Complete this exercise __________ times a day. Straight leg raises, abductors This exercise strengthens the muscles that rotate the leg at the hip and move it away from your body (hip abductors). Lie on your side with your left / right leg in the top position. Lie so your head, shoulder, knee, and hip line up. You may bend your bottom knee to help you keep your balance. Roll your hips slightly forward, so that your hips are stacked directly over each other and your left / right knee is facing forward. Leading with your heel, lift your top leg 4-6 inches (10-15 cm). You should feel the muscles in your outer hip lifting. Do not let your foot drift forward. Do not let your knee roll toward the ceiling. Hold this position for __________ seconds. Slowly lower your leg to the starting position. Let your muscles relax completely after each repetition. Repeat __________ times. Complete this exercise __________ times a day. This information is not intended to replace advice given to you by your health care provider. Make sure you discuss any questions you have with your health care provider. Document Revised: 05/15/2021 Document Reviewed: 05/15/2021 Elsevier Patient Education  2024 [employer].

## 2023-09-26 ENCOUNTER — Telehealth: Payer: Self-pay

## 2023-09-26 NOTE — Telephone Encounter (Signed)
Called patient but her granddaughter is crying in the background. So patient request a call back on Monday.

## 2023-09-26 NOTE — Telephone Encounter (Signed)
Pt requesting call back to schedule colonoscopy.

## 2023-09-29 ENCOUNTER — Other Ambulatory Visit: Payer: Self-pay | Admitting: *Deleted

## 2023-09-29 ENCOUNTER — Telehealth: Payer: Self-pay | Admitting: *Deleted

## 2023-09-29 DIAGNOSIS — Z1211 Encounter for screening for malignant neoplasm of colon: Secondary | ICD-10-CM

## 2023-09-29 MED ORDER — NA SULFATE-K SULFATE-MG SULF 17.5-3.13-1.6 GM/177ML PO SOLN: 1.0000 | Freq: Once | ORAL | 0 refills | Status: AC

## 2023-09-29 NOTE — Telephone Encounter (Signed)
Gastroenterology Pre-Procedure Review  Request Date: 11/17/2023 Requesting Physician: Dr. Allegra Lai  PATIENT REVIEW QUESTIONS: The patient responded to the following health history questions as indicated:    1. Are you having any GI issues? no 2. Do you have a personal history of Polyps? no 3. Do you have a family history of Colon Cancer or Polyps? no 4. Diabetes Mellitus? no 5. Joint replacements in the past 12 months?no 6. Major health problems in the past 3 months?no 7. Any artificial heart valves, MVP, or defibrillator?no    MEDICATIONS & ALLERGIES:    Patient reports the following regarding taking any anticoagulation/antiplatelet therapy:   Plavix, Coumadin, Eliquis, Xarelto, Lovenox, Pradaxa, Brilinta, or Effient? no Aspirin? no  Patient confirms/reports the following medications:  Current Outpatient Medications  Medication Sig Dispense Refill   Na Sulfate-K Sulfate-Mg Sulf 17.5-3.13-1.6 GM/177ML SOLN Take 1 kit by mouth once for 1 dose. 354 mL 0   cetirizine (ZYRTEC) 10 MG tablet Take 1 tablet (10 mg total) by mouth daily. 30 tablet 11   estradiol (ESTRACE VAGINAL) 0.1 MG/GM vaginal cream Place 1 Applicatorful vaginally at bedtime. 42.5 g 12   fenofibrate (TRICOR) 48 MG tablet Take 1 tablet (48 mg total) by mouth daily. 90 tablet 1   lisinopril-hydrochlorothiazide (ZESTORETIC) 20-25 MG tablet Take 1 tablet by mouth daily. 90 tablet 1   meloxicam (MOBIC) 7.5 MG tablet Take 1 tablet (7.5 mg total) by mouth daily. 30 tablet 0   methocarbamol (ROBAXIN) 750 MG tablet Take 1 tablet (750 mg total) by mouth every 8 (eight) hours as needed. 90 tablet 1   simvastatin (ZOCOR) 10 MG tablet Take 1 tablet (10 mg total) by mouth at bedtime. 90 tablet 1   No current facility-administered medications for this visit.    Patient confirms/reports the following allergies:  No Known Allergies  No orders of the defined types were placed in this encounter.   AUTHORIZATION INFORMATION Primary  Insurance: 1D#: Group #:  Secondary Insurance: 1D#: Group #:  SCHEDULE INFORMATION: Date: 11/17/2023  Time: Location:  [facility]

## 2023-09-29 NOTE — Telephone Encounter (Signed)
Colonoscopy schedule on 11/17/2023 with Dr Allegra Lai

## 2023-09-29 NOTE — Telephone Encounter (Signed)
Patient called back to schedule procedure.

## 2023-10-01 ENCOUNTER — Encounter: Payer: Self-pay | Admitting: Internal Medicine

## 2023-10-06 ENCOUNTER — Ambulatory Visit: Payer: BC Managed Care – PPO | Admitting: Internal Medicine

## 2023-10-06 ENCOUNTER — Encounter: Payer: Self-pay | Admitting: Internal Medicine

## 2023-10-06 ENCOUNTER — Other Ambulatory Visit: Payer: Self-pay

## 2023-10-06 ENCOUNTER — Ambulatory Visit (INDEPENDENT_AMBULATORY_CARE_PROVIDER_SITE_OTHER): Payer: BC Managed Care – PPO | Admitting: Internal Medicine

## 2023-10-06 VITALS — BP 122/84 | HR 98 | Temp 98.2°F | Resp 16 | Ht 65.0 in | Wt 181.7 lb

## 2023-10-06 DIAGNOSIS — J069 Acute upper respiratory infection, unspecified: Secondary | ICD-10-CM | POA: Diagnosis not present

## 2023-10-06 DIAGNOSIS — H6122 Impacted cerumen, left ear: Secondary | ICD-10-CM | POA: Diagnosis not present

## 2023-10-06 LAB — POCT INFLUENZA A/B
Influenza A, POC: NEGATIVE
Influenza B, POC: NEGATIVE

## 2023-10-06 MED ORDER — METHYLPREDNISOLONE 4 MG PO TBPK: ORAL_TABLET | ORAL | 0 refills | Status: DC

## 2023-10-06 MED ORDER — AMOXICILLIN-POT CLAVULANATE 875-125 MG PO TABS: 1.0000 | ORAL_TABLET | Freq: Two times a day (BID) | ORAL | 0 refills | Status: AC

## 2023-10-06 MED ORDER — BENZONATATE 100 MG PO CAPS: 100.0000 mg | ORAL_CAPSULE | Freq: Three times a day (TID) | ORAL | 0 refills | Status: DC | PRN

## 2023-10-06 NOTE — Progress Notes (Signed)
Acute Office Visit  Subjective:     Patient ID: April Murphy, female    DOB: 1961-03-17, 62 y.o.   MRN: [id number]  Chief Complaint  Patient presents with   URI    Cough, congested, bodyache, sore throat for 8 days    HPI Patient is in today for cough, congestion, myalgias for 8 days.   URI Compliant:  -Fever: no, no thermometer but aching with chills  -Cough: yes, started dry but now more productive, white and thick -Shortness of breath: no -Wheezing: no -Chest tightness: yes -Chest congestion: yes -Nasal congestion: yes -Runny nose: yes, yellow mucus -Post nasal drip: yes -Sore throat: yes, comes and goes  -Sinus pressure: yes -Face pain: no -Toothache: yes -Ear pain: yes left -Ear pressure: yes left -Sick contacts: yes -Context: worse -Treatments attempted: cold/sinus and anti-histamine    Review of Systems  Constitutional:  Positive for chills and malaise/fatigue. Negative for fever.  HENT:  Positive for congestion, ear pain and sore throat. Negative for sinus pain.   Respiratory:  Positive for cough and sputum production. Negative for shortness of breath and wheezing.   Cardiovascular:  Negative for chest pain.        Objective:    BP 122/84 (Patient Position: Sitting)   Pulse 98   Temp 98.2 F (36.8 C) (Oral)   Resp 16   Ht 5\' 5"  (1.651 m)   Wt 181 lb 11.2 oz (82.4 kg)   SpO2 98%   BMI 30.24 kg/m  BP Readings from Last 3 Encounters:  10/06/23 122/84  09/24/23 124/76  03/19/23 124/80   Wt Readings from Last 3 Encounters:  10/06/23 181 lb 11.2 oz (82.4 kg)  09/24/23 185 lb (83.9 kg)  03/19/23 175 lb 4.8 oz (79.5 kg)      Physical Exam Constitutional:      Appearance: Normal appearance.  HENT:     Head: Normocephalic and atraumatic.     Right Ear: Tympanic membrane, ear canal and external ear normal.     Left Ear: Tympanic membrane, ear canal and external ear normal.     Nose: Congestion present.     Mouth/Throat:     Mouth: Mucous  membranes are moist.     Pharynx: Oropharynx is clear.  Eyes:     Conjunctiva/sclera: Conjunctivae normal.  Cardiovascular:     Rate and Rhythm: Normal rate and regular rhythm.  Pulmonary:     Effort: Pulmonary effort is normal.     Breath sounds: Normal breath sounds. No wheezing, rhonchi or rales.  Skin:    General: Skin is warm and dry.  Neurological:     General: No focal deficit present.     Mental Status: She is alert. Mental status is at baseline.  Psychiatric:        Mood and Affect: Mood normal.        Behavior: Behavior normal.     Results for orders placed or performed in visit on 10/06/23  POCT Influenza A/B  Result Value Ref Range   Influenza A, POC Negative Negative   Influenza B, POC Negative Negative        Assessment & Plan:   1. Upper respiratory tract infection, unspecified type (Primary): Flu negative, COVID pending. Will treat viral symptoms with steroid pack and cough suppressants. Due to the holidays will order an antibiotic to pick up only if symptoms persist 10-14 days.   - POCT Influenza A/B - Novel Coronavirus, NAA ([facility]) - methylPREDNISolone (MEDROL DOSEPAK)  4 MG TBPK tablet; Day 1: Take 8 mg (2 tablets) before breakfast, 4 mg (1 tablet) after lunch, 4 mg (1 tablet) after supper, and 8 mg (2 tablets) at bedtime. Day 2:Take 4 mg (1 tablet) before breakfast, 4 mg (1 tablet) after lunch, 4 mg (1 tablet) after supper, and 8 mg (2 tablets) at bedtime. Day 3: Take 4 mg (1 tablet) before breakfast, 4 mg (1 tablet) after lunch, 4 mg (1 tablet) after supper, and 4 mg (1 tablet) at bedtime. Day 4: Take 4 mg (1 tablet) before breakfast, 4 mg (1 tablet) after lunch, and 4 mg (1 tablet) at bedtime. Day 5: Take 4 mg (1 tablet) before breakfast and 4 mg (1 tablet) at bedtime. Day 6: Take 4 mg (1 tablet) before breakfast.  Dispense: 1 each; Refill: 0 - amoxicillin-clavulanate (AUGMENTIN) 875-125 MG tablet; Take 1 tablet by mouth 2 (two) times daily for 5 days.   Dispense: 10 tablet; Refill: 0 - benzonatate (TESSALON PERLES) 100 MG capsule; Take 1 capsule (100 mg total) by mouth 3 (three) times daily as needed for cough.  Dispense: 20 capsule; Refill: 0  2. Impacted cerumen of left ear: Ear cleaned with warm water and hydrogen peroxide, patient tolerated procedure well.   - Ear Lavage   Return if symptoms worsen or fail to improve.  Margarita Mail, DO

## 2023-10-08 LAB — NOVEL CORONAVIRUS, NAA: SARS-CoV-2, NAA: NOT DETECTED

## 2023-10-08 LAB — SPECIMEN STATUS REPORT

## 2023-10-10 ENCOUNTER — Other Ambulatory Visit: Payer: Self-pay | Admitting: Internal Medicine

## 2023-10-10 NOTE — Telephone Encounter (Signed)
Requested medication (s) are due for refill today: yes  Requested medication (s) are on the active medication list: yes  Last refill:  08/27/23 #30/0  Future visit scheduled: yes  Notes to clinic:  rx was for 30 days, ok to refill?     Requested Prescriptions  Pending Prescriptions Disp Refills   meloxicam (MOBIC) 7.5 MG tablet [Pharmacy Med Name: MELOXICAM 7.5 MG TABLET] 30 tablet 1    Sig: TAKE 1 TABLET BY MOUTH EVERY DAY AS NEEDED FOR 30 DAYS     Analgesics:  COX2 Inhibitors Failed - 10/10/2023  1:40 PM      Failed - Manual Review: Labs are only required if the patient has taken medication for more than 8 weeks.      Passed - HGB in normal range and within 360 days    Hemoglobin  Date Value Ref Range Status  03/20/2023 12.7 11.7 - 15.5 g/dL Final         Passed - Cr in normal range and within 360 days    Creat  Date Value Ref Range Status  03/20/2023 0.71 0.50 - 1.05 mg/dL Final         Passed - HCT in normal range and within 360 days    HCT  Date Value Ref Range Status  03/20/2023 37.7 35.0 - 45.0 % Final         Passed - AST in normal range and within 360 days    AST  Date Value Ref Range Status  03/20/2023 17 10 - 35 U/L Final         Passed - ALT in normal range and within 360 days    ALT  Date Value Ref Range Status  03/20/2023 22 6 - 29 U/L Final         Passed - eGFR is 30 or above and within 360 days    eGFR  Date Value Ref Range Status  03/20/2023 95 > OR = 60 mL/min/1.67m2 Final         Passed - Patient is not pregnant      Passed - Valid encounter within last 12 months    Recent Outpatient Visits           4 days ago Upper respiratory tract infection, unspecified type   [facility] Margarita Mail, DO   2 weeks ago Primary hypertension   [facility] Margarita Mail, DO   6 months ago Primary hypertension   [facility] Margarita Mail, DO   11  months ago Rhinosinusitis   [facility] Danelle Berry, PA-C   1 year ago Primary hypertension   [facility] Margarita Mail, DO       Future Appointments             In 5 months Margarita Mail, DO [facility], [facility]

## 2023-11-10 ENCOUNTER — Other Ambulatory Visit: Payer: Self-pay | Admitting: Internal Medicine

## 2023-11-11 NOTE — Telephone Encounter (Signed)
Requested medications are due for refill today.  Too soon  Requested medications are on the active medications list.  yes  Last refill. 10/10/2023 #30 1 rf  Future visit scheduled.   yes  Notes to clinic.  Last sig has pt taking med for 30 days. Please advise.   Requested Prescriptions  Pending Prescriptions Disp Refills   meloxicam (MOBIC) 7.5 MG tablet [Pharmacy Med Name: MELOXICAM 7.5 MG TABLET] 30 tablet 1    Sig: TAKE 1 TABLET BY MOUTH EVERY DAY AS NEEDED     Analgesics:  COX2 Inhibitors Failed - 11/11/2023  8:35 AM      Failed - Manual Review: Labs are only required if the patient has taken medication for more than 8 weeks.      Passed - HGB in normal range and within 360 days    Hemoglobin  Date Value Ref Range Status  03/20/2023 12.7 11.7 - 15.5 g/dL Final         Passed - Cr in normal range and within 360 days    Creat  Date Value Ref Range Status  03/20/2023 0.71 0.50 - 1.05 mg/dL Final         Passed - HCT in normal range and within 360 days    HCT  Date Value Ref Range Status  03/20/2023 37.7 35.0 - 45.0 % Final         Passed - AST in normal range and within 360 days    AST  Date Value Ref Range Status  03/20/2023 17 10 - 35 U/L Final         Passed - ALT in normal range and within 360 days    ALT  Date Value Ref Range Status  03/20/2023 22 6 - 29 U/L Final         Passed - eGFR is 30 or above and within 360 days    eGFR  Date Value Ref Range Status  03/20/2023 95 > OR = 60 mL/min/1.4m2 Final         Passed - Patient is not pregnant      Passed - Valid encounter within last 12 months    Recent Outpatient Visits           1 month ago Upper respiratory tract infection, unspecified type   [facility] Margarita Mail, DO   1 month ago Primary hypertension   [facility] Margarita Mail, DO   7 months ago Primary hypertension   [facility] Margarita Mail, DO   1 year ago Rhinosinusitis   [facility] Danelle Berry, PA-C   1 year ago Primary hypertension   [facility] Margarita Mail, DO       Future Appointments             In 4 months Margarita Mail, DO [facility], [facility]

## 2023-11-12 ENCOUNTER — Telehealth: Payer: Self-pay

## 2023-11-12 NOTE — Telephone Encounter (Addendum)
The patient called in requesting [place] to call her back. I called the patient back to let her know we received her message, and she has a question for Johny Drilling about over counter medication.

## 2023-11-12 NOTE — Telephone Encounter (Signed)
Spoken to patient and inform her that she can take omeprazole. It is not a medication that need to stop 5 days prior. Also patient was concern about tea which she can have, no restriction there.    Inform to call again if she have other concerns.

## 2023-11-14 ENCOUNTER — Encounter: Payer: Self-pay | Admitting: Gastroenterology

## 2023-11-17 ENCOUNTER — Ambulatory Visit: Payer: 59 | Admitting: Certified Registered Nurse Anesthetist

## 2023-11-17 ENCOUNTER — Encounter
Admission: RE | Disposition: A | Discharge status: Home / Self Care | Payer: Self-pay | Source: Ambulatory Visit | Attending: Gastroenterology

## 2023-11-17 ENCOUNTER — Ambulatory Visit
Admission: RE | Admit: 2023-11-17 | Discharge: 2023-11-17 | Disposition: A | Discharge status: Home / Self Care | Payer: 59 | Source: Ambulatory Visit | Attending: Gastroenterology | Admitting: Gastroenterology

## 2023-11-17 DIAGNOSIS — D120 Benign neoplasm of cecum: Secondary | ICD-10-CM | POA: Diagnosis not present

## 2023-11-17 DIAGNOSIS — K5140 Inflammatory polyps of colon without complications: Secondary | ICD-10-CM | POA: Diagnosis not present

## 2023-11-17 DIAGNOSIS — Z79899 Other long term (current) drug therapy: Secondary | ICD-10-CM | POA: Insufficient documentation

## 2023-11-17 DIAGNOSIS — K635 Polyp of colon: Secondary | ICD-10-CM

## 2023-11-17 DIAGNOSIS — K5730 Diverticulosis of large intestine without perforation or abscess without bleeding: Secondary | ICD-10-CM | POA: Diagnosis not present

## 2023-11-17 DIAGNOSIS — K56699 Other intestinal obstruction unspecified as to partial versus complete obstruction: Secondary | ICD-10-CM | POA: Diagnosis not present

## 2023-11-17 DIAGNOSIS — I10 Essential (primary) hypertension: Secondary | ICD-10-CM | POA: Insufficient documentation

## 2023-11-17 DIAGNOSIS — Z1211 Encounter for screening for malignant neoplasm of colon: Secondary | ICD-10-CM | POA: Diagnosis present

## 2023-11-17 HISTORY — PX: POLYPECTOMY: SHX5525

## 2023-11-17 HISTORY — PX: COLONOSCOPY WITH PROPOFOL: SHX5780

## 2023-11-17 SURGERY — COLONOSCOPY WITH PROPOFOL
Anesthesia: General

## 2023-11-17 MED ORDER — PROPOFOL 10 MG/ML IV BOLUS
INTRAVENOUS | Status: AC
  Filled 2023-11-17: qty 20

## 2023-11-17 MED ORDER — PHENYLEPHRINE 80 MCG/ML (10ML) SYRINGE FOR IV PUSH (FOR BLOOD PRESSURE SUPPORT)
PREFILLED_SYRINGE | INTRAVENOUS | Status: AC
  Filled 2023-11-17: qty 10

## 2023-11-17 MED ORDER — PHENYLEPHRINE 80 MCG/ML (10ML) SYRINGE FOR IV PUSH (FOR BLOOD PRESSURE SUPPORT)
PREFILLED_SYRINGE | INTRAVENOUS | Status: DC | PRN
  Administered 2023-11-17: 80 ug via INTRAVENOUS

## 2023-11-17 MED ORDER — ONDANSETRON HCL 4 MG/2ML IJ SOLN
INTRAMUSCULAR | Status: AC
  Filled 2023-11-17: qty 2

## 2023-11-17 MED ORDER — DEXAMETHASONE SODIUM PHOSPHATE 10 MG/ML IJ SOLN
INTRAMUSCULAR | Status: AC
  Filled 2023-11-17: qty 1

## 2023-11-17 MED ORDER — PROPOFOL 500 MG/50ML IV EMUL
INTRAVENOUS | Status: DC | PRN
  Administered 2023-11-17: 160 ug/kg/min via INTRAVENOUS

## 2023-11-17 MED ORDER — GLYCOPYRROLATE 0.2 MG/ML IJ SOLN
INTRAMUSCULAR | Status: AC
  Filled 2023-11-17: qty 1

## 2023-11-17 MED ORDER — GLYCOPYRROLATE 0.2 MG/ML IJ SOLN
INTRAMUSCULAR | Status: DC | PRN
  Administered 2023-11-17: .2 mg via INTRAVENOUS

## 2023-11-17 MED ORDER — PROPOFOL 10 MG/ML IV BOLUS
INTRAVENOUS | Status: DC | PRN
  Administered 2023-11-17: 20 mg via INTRAVENOUS
  Administered 2023-11-17: 70 mg via INTRAVENOUS

## 2023-11-17 MED ORDER — SODIUM CHLORIDE 0.9 % IV SOLN
INTRAVENOUS | Status: DC
  Administered 2023-11-17: 20 mL/h via INTRAVENOUS

## 2023-11-17 MED ORDER — LIDOCAINE HCL (PF) 2 % IJ SOLN
INTRAMUSCULAR | Status: AC
  Filled 2023-11-17: qty 5

## 2023-11-17 MED ORDER — DEXMEDETOMIDINE HCL IN NACL 80 MCG/20ML IV SOLN
INTRAVENOUS | Status: DC | PRN
  Administered 2023-11-17: 8 ug via INTRAVENOUS
  Administered 2023-11-17: 4 ug via INTRAVENOUS

## 2023-11-17 NOTE — Anesthesia Postprocedure Evaluation (Signed)
Anesthesia Post Note  Patient: April Murphy  Procedure(s) Performed: COLONOSCOPY WITH PROPOFOL POLYPECTOMY  Patient location during evaluation: Endoscopy Anesthesia Type: General Level of consciousness: awake and alert Pain management: pain level controlled Vital Signs Assessment: post-procedure vital signs reviewed and stable Respiratory status: spontaneous breathing, nonlabored ventilation, respiratory function stable and patient connected to nasal cannula oxygen Cardiovascular status: blood pressure returned to baseline and stable Postop Assessment: no apparent nausea or vomiting Anesthetic complications: no  No notable events documented.   Last Vitals:  Vitals:   11/17/23 1000 11/17/23 1010  BP: 114/66 117/79  Pulse: 90 83  Resp: 14 17  Temp:    SpO2: 98% 98%    Last Pain:  Vitals:   11/17/23 1010  TempSrc:   PainSc: 0-No pain                 Stephanie Coup

## 2023-11-17 NOTE — Anesthesia Procedure Notes (Signed)
Date/Time: 11/17/2023 9:15 AM  Performed by: Malva Cogan, CRNAPre-anesthesia Checklist: Patient identified, Emergency Drugs available, Suction available, Patient being monitored and Timeout performed Patient Re-evaluated:Patient Re-evaluated prior to induction Oxygen Delivery Method: Nasal cannula Induction Type: IV induction Placement Confirmation: CO2 detector and positive ETCO2

## 2023-11-17 NOTE — Transfer of Care (Signed)
Immediate Anesthesia Transfer of Care Note  Patient: April Murphy  Procedure(s) Performed: COLONOSCOPY WITH PROPOFOL POLYPECTOMY  Patient Location: PACU  Anesthesia Type:General  Level of Consciousness: drowsy  Airway & Oxygen Therapy: Patient Spontanous Breathing and Patient connected to nasal cannula oxygen  Post-op Assessment: Report given to RN and Post -op Vital signs reviewed and stable  Post vital signs: Reviewed and stable  Last Vitals:  Vitals Value Taken Time  BP 100/65   Temp    Pulse 72   Resp    SpO2 98     Last Pain:  Vitals:   11/17/23 0755  TempSrc: Temporal  PainSc: 0-No pain         Complications: No notable events documented.

## 2023-11-17 NOTE — Op Note (Signed)
[facility] Gastroenterology Patient Name: April Murphy Procedure Date: 11/17/2023 9:04 AM MRN: [id number] Account #: [id number] Date of Birth: 07/08/1961 Admit Type: Outpatient Age: 63 Room: [facility] ENDO ROOM 4 Gender: Female Note Status: Finalized Instrument Name: Colonoscope [id number] Procedure:             Colonoscopy Indications:           Screening for colorectal malignant neoplasm Providers:             Toney Reil MD, MD Medicines:             General Anesthesia Complications:         No immediate complications. Estimated blood loss: None. Procedure:             Pre-Anesthesia Assessment:                        - Prior to the procedure, a History and Physical was                         performed, and patient medications and allergies were                         reviewed. The patient is competent. The risks and                         benefits of the procedure and the sedation options and                         risks were discussed with the patient. All questions                         were answered and informed consent was obtained.                         Patient identification and proposed procedure were                         verified by the physician, the nurse, the                         anesthesiologist, the anesthetist and the technician                         in the pre-procedure area in the procedure room in the                         endoscopy suite. Mental Status Examination: alert and                         oriented. Airway Examination: normal oropharyngeal                         airway and neck mobility. Respiratory Examination:                         clear to auscultation. CV Examination: normal.                         Prophylactic Antibiotics: The  patient does not require                         prophylactic antibiotics. Prior Anticoagulants: The                         patient has taken no anticoagulant or antiplatelet                          agents. ASA Grade Assessment: II - A patient with mild                         systemic disease. After reviewing the risks and                         benefits, the patient was deemed in satisfactory                         condition to undergo the procedure. The anesthesia                         plan was to use general anesthesia. Immediately prior                         to administration of medications, the patient was                         re-assessed for adequacy to receive sedatives. The                         heart rate, respiratory rate, oxygen saturations,                         blood pressure, adequacy of pulmonary ventilation, and                         response to care were monitored throughout the                         procedure. The physical status of the patient was                         re-assessed after the procedure.                        After obtaining informed consent, the colonoscope was                         passed under direct vision. Throughout the procedure,                         the patient's blood pressure, pulse, and oxygen                         saturations were monitored continuously. The                         Colonoscope was introduced through the anus and  advanced to the the cecum, identified by appendiceal                         orifice and ileocecal valve. The colonoscopy was                         extremely difficult due to multiple diverticula in the                         colon and restricted mobility of the colon. Successful                         completion of the procedure was aided by withdrawing                         the scope and replacing with the pediatric colonoscope                         and applying abdominal pressure. The patient tolerated                         the procedure well. The quality of the bowel                         preparation was evaluated using the BBPS  [facility] Bowel                         Preparation Scale) with scores of: Right Colon = 3,                         Transverse Colon = 3 and Left Colon = 3 (entire mucosa                         seen well with no residual staining, small fragments                         of stool or opaque liquid). The total BBPS score                         equals 9. The ileocecal valve, appendiceal orifice,                         and rectum were photographed. Findings:      The perianal and digital rectal examinations were normal. Pertinent       negatives include normal sphincter tone and no palpable rectal lesions.      A 5 mm polyp was found in the cecum. The polyp was sessile. The polyp       was removed with a cold snare. Resection and retrieval were complete.      A 5 mm polyp was found in the sigmoid colon. The polyp was sessile. The       polyp was removed with a cold snare. Resection and retrieval were       complete. Estimated blood loss was minimal.      Multiple diverticula were found in the recto-sigmoid colon and sigmoid       colon. There was narrowing of the colon  in association with the       diverticular opening. Peri-diverticular erythema was seen.      The retroflexed view of the distal rectum and anal verge was normal and       showed no anal or rectal abnormalities. Impression:            - One 5 mm polyp in the cecum, removed with a cold                         snare. Resected and retrieved.                        - One 5 mm polyp in the sigmoid colon, removed with a                         cold snare. Resected and retrieved.                        - Severe diverticulosis in the recto-sigmoid colon and                         in the sigmoid colon. There was narrowing of the colon                         in association with the diverticular opening.                         Peri-diverticular erythema was seen.                        - The distal rectum and anal verge are normal on                          retroflexion view. Recommendation:        - Discharge patient to home (with escort).                        - Resume previous diet today.                        - Continue present medications.                        - Await pathology results.                        - Repeat colonoscopy in 5-10 years for surveillance                         based on pathology results. Procedure Code(s):     --- Professional ---                        [phone], Colonoscopy, flexible; with removal of                         tumor(s), polyp(s), or other lesion(s) by snare                         technique Diagnosis Code(s):     ---  Professional ---                        Z12.11, Encounter for screening for malignant neoplasm                         of colon                        D12.0, Benign neoplasm of cecum                        D12.5, Benign neoplasm of sigmoid colon                        K57.30, Diverticulosis of large intestine without                         perforation or abscess without bleeding CPT copyright 2022 American Medical Association. All rights reserved. The codes documented in this report are preliminary and upon coder review may  be revised to meet current compliance requirements. Dr. Libby Maw Toney Reil MD, MD 11/17/2023 9:50:18 AM This report has been signed electronically. Number of Addenda: 0 Note Initiated On: 11/17/2023 9:04 AM Scope Withdrawal Time: 0 hours 18 minutes 9 seconds  Total Procedure Duration: 0 hours 28 minutes 14 seconds  Estimated Blood Loss:  Estimated blood loss: none.      [facility]

## 2023-11-17 NOTE — Anesthesia Preprocedure Evaluation (Signed)
Anesthesia Evaluation  Patient identified by MRN, date of birth, ID band Patient awake    Reviewed: Allergy & Precautions, NPO status , Patient's Chart, lab work & pertinent test results  Airway Mallampati: III  TM Distance: >3 FB Neck ROM: full    Dental  (+) Chipped   Pulmonary neg pulmonary ROS   Pulmonary exam normal        Cardiovascular hypertension, On Medications negative cardio ROS Normal cardiovascular exam     Neuro/Psych negative neurological ROS  negative psych ROS   GI/Hepatic negative GI ROS, Neg liver ROS,,,  Endo/Other  negative endocrine ROS    Renal/GU negative Renal ROS  negative genitourinary   Musculoskeletal   Abdominal   Peds  Hematology negative hematology ROS (+)   Anesthesia Other Findings Past Medical History: No date: Hyperlipidemia No date: Hypertension  Past Surgical History: No date: BREAST BIOPSY     Comment:  Since early 61s per pt No date: BREAST SURGERY No date: CESAREAN SECTION No date: CHOLECYSTECTOMY  BMI    Body Mass Index: 30.39 kg/m      Reproductive/Obstetrics negative OB ROS                             Anesthesia Physical Anesthesia Plan  ASA: 2  Anesthesia Plan: General   Post-op Pain Management: Minimal or no pain anticipated   Induction: Intravenous  PONV Risk Score and Plan: 3 and Propofol infusion, TIVA and Ondansetron  Airway Management Planned: Nasal Cannula  Additional Equipment: None  Intra-op Plan:   Post-operative Plan:   Informed Consent: I have reviewed the patients History and Physical, chart, labs and discussed the procedure including the risks, benefits and alternatives for the proposed anesthesia with the patient or authorized representative who has indicated his/her understanding and acceptance.     Dental advisory given  Plan Discussed with: CRNA and Surgeon  Anesthesia Plan Comments:  (Discussed risks of anesthesia with patient, including possibility of difficulty with spontaneous ventilation under anesthesia necessitating airway intervention, PONV, and rare risks such as cardiac or respiratory or neurological events, and allergic reactions. Discussed the role of CRNA in patient's perioperative care. Patient understands.)       Anesthesia Quick Evaluation

## 2023-11-17 NOTE — H&P (Signed)
Arlyss Repress, MD [address]  [address]  [place], [place] [address]  Main: [phone]  Fax: [phone] Pager: [phone]  Primary Care Physician:  Margarita Mail, DO Primary Gastroenterologist:  Dr. Arlyss Repress  Pre-Procedure History & Physical: HPI:  April Murphy is a 63 y.o. female is here for an colonoscopy.   Past Medical History:  Diagnosis Date   Hyperlipidemia    Hypertension     Past Surgical History:  Procedure Laterality Date   BREAST BIOPSY     Since early 41s per pt   BREAST SURGERY     CESAREAN SECTION     CHOLECYSTECTOMY      Prior to Admission medications   Medication Sig Start Date End Date Taking? Authorizing Provider  benzonatate (TESSALON PERLES) 100 MG capsule Take 1 capsule (100 mg total) by mouth 3 (three) times daily as needed for cough. 10/06/23  Yes Margarita Mail, DO  cetirizine (ZYRTEC) 10 MG tablet Take 1 tablet (10 mg total) by mouth daily. 03/19/23  Yes Margarita Mail, DO  estradiol (ESTRACE VAGINAL) 0.1 MG/GM vaginal cream Place 1 Applicatorful vaginally at bedtime. 09/18/22  Yes Margarita Mail, DO  fenofibrate (TRICOR) 48 MG tablet Take 1 tablet (48 mg total) by mouth daily. 09/24/23  Yes Margarita Mail, DO  lisinopril-hydrochlorothiazide (ZESTORETIC) 20-25 MG tablet Take 1 tablet by mouth daily. 09/24/23  Yes Margarita Mail, DO  meloxicam (MOBIC) 7.5 MG tablet TAKE 1 TABLET BY MOUTH EVERY DAY AS NEEDED FOR 30 DAYS 10/10/23  Yes Margarita Mail, DO  methocarbamol (ROBAXIN) 750 MG tablet Take 1 tablet (750 mg total) by mouth every 8 (eight) hours as needed. 04/21/23  Yes Margarita Mail, DO  methylPREDNISolone (MEDROL DOSEPAK) 4 MG TBPK tablet Day 1: Take 8 mg (2 tablets) before breakfast, 4 mg (1 tablet) after lunch, 4 mg (1 tablet) after supper, and 8 mg (2 tablets) at bedtime. Day 2:Take 4 mg (1 tablet) before breakfast, 4 mg (1 tablet) after lunch, 4 mg (1 tablet) after supper, and 8 mg (2 tablets)  at bedtime. Day 3: Take 4 mg (1 tablet) before breakfast, 4 mg (1 tablet) after lunch, 4 mg (1 tablet) after supper, and 4 mg (1 tablet) at bedtime. Day 4: Take 4 mg (1 tablet) before breakfast, 4 mg (1 tablet) after lunch, and 4 mg (1 tablet) at bedtime. Day 5: Take 4 mg (1 tablet) before breakfast and 4 mg (1 tablet) at bedtime. Day 6: Take 4 mg (1 tablet) before breakfast. 10/06/23  Yes Margarita Mail, DO  simvastatin (ZOCOR) 10 MG tablet Take 1 tablet (10 mg total) by mouth at bedtime. 09/24/23  Yes Margarita Mail, DO    Allergies as of 09/29/2023   (No Known Allergies)    Family History  Problem Relation Age of Onset   Hypertension Father    Hypertension Sister    Hyperlipidemia Sister    Hypertension Brother    Hypertension Son    Breast cancer Other     Social History   Socioeconomic History   Marital status: Married    Spouse name: Not on file   Number of children: Not on file   Years of education: Not on file   Highest education level: 12th grade  Occupational History   Not on file  Tobacco Use   Smoking status: Never   Smokeless tobacco: Never  Vaping Use   Vaping status: Never Used  Substance and Sexual Activity   Alcohol use: Never   Drug  use: Not on file   Sexual activity: Yes  Other Topics Concern   Not on file  Social History Narrative   Not on file   Social Drivers of Health   Financial Resource Strain: Low Risk  (09/23/2023)   Overall Financial Resource Strain (CARDIA)    Difficulty of Paying Living Expenses: Not hard at all  Food Insecurity: No Food Insecurity (09/23/2023)   Hunger Vital Sign    Worried About Running Out of Food in the Last Year: Never true    Ran Out of Food in the Last Year: Never true  Transportation Needs: No Transportation Needs (09/23/2023)   PRAPARE - Administrator, Civil Service (Medical): No    Lack of Transportation (Non-Medical): No  Physical Activity: Insufficiently Active (09/23/2023)   Exercise  Vital Sign    Days of Exercise per Week: 3 days    Minutes of Exercise per Session: 20 min  Stress: No Stress Concern Present (09/23/2023)   [employer] of Occupational Health - Occupational Stress Questionnaire    Feeling of Stress : Not at all  Social Connections: Socially Integrated (09/23/2023)   Social Connection and Isolation Panel [NHANES]    Frequency of Communication with Friends and Family: More than three times a week    Frequency of Social Gatherings with Friends and Family: Three times a week    Attends Religious Services: More than 4 times per year    Active Member of Clubs or Organizations: Yes    Attends Engineer, structural: More than 4 times per year    Marital Status: Married  Catering manager Violence: Not on file    Review of Systems: See HPI, otherwise negative ROS  Physical Exam: BP 134/69   Pulse 94   Temp (!) 97.4 F (36.3 C) (Temporal)   Resp 20   Ht 5\' 5"  (1.651 m)   Wt 82.8 kg   SpO2 100%   BMI 30.39 kg/m  General:   Alert,  pleasant and cooperative in NAD Head:  Normocephalic and atraumatic. Neck:  Supple; no masses or thyromegaly. Lungs:  Clear throughout to auscultation.    Heart:  Regular rate and rhythm. Abdomen:  Soft, nontender and nondistended. Normal bowel sounds, without guarding, and without rebound.   Neurologic:  Alert and  oriented x4;  grossly normal neurologically.  Impression/Plan: April Murphy is here for an colonoscopy to be performed for colon cancer screening  Risks, benefits, limitations, and alternatives regarding  colonoscopy have been reviewed with the patient.  Questions have been answered.  All parties agreeable.   Lannette Donath, MD  11/17/2023, 7:58 AM

## 2023-11-18 ENCOUNTER — Encounter: Payer: Self-pay | Admitting: Gastroenterology

## 2023-11-18 LAB — SURGICAL PATHOLOGY

## 2024-03-05 ENCOUNTER — Encounter: Payer: Self-pay | Admitting: Internal Medicine

## 2024-03-09 ENCOUNTER — Ambulatory Visit: Payer: Self-pay | Admitting: *Deleted

## 2024-03-09 NOTE — Telephone Encounter (Signed)
  Chief Complaint: red rash burning, itching Symptoms: red rash , burning itching, linear started on left chest like "sunburn" but has not been in sun. Now rash left side and left arm. No blistering no fever Frequency: started 1 week ago and last Friday spreading  Pertinent Negatives: Patient denies chest pain no difficulty breathing no fever no blisters on rash and no drainage Disposition: [] ED /[] Urgent Care (no appt availability in office) / [x] Appointment(In office/virtual)/ []  [facility]/ [] Home Care/ [] Refused Recommended Disposition /[] [facility]/ []  Follow-up with PCP Additional Notes:    No available appt with PCP  until Thursday. Scheduled appt with other provider tomorrow due to patient has another appt.  Recommended to go to UC if fever noted . Care advise recommended see chart.      Copied from CRM [id number]. Topic: Clinical - Red Word Triage >> Mar 09, 2024 10:41 AM Oddis Bench wrote: Red Word that prompted transfer to Nurse Triage: Patient is calling bc she is stting that she is in pain, she believes  it may be shingles started over the weekend. She has noticed break out on left side of chest , side and arm. Reason for Disposition  [1] Localized rash is very painful AND [2] no fever  Answer Assessment - Initial Assessment Questions 1. APPEARANCE of RASH: "Describe the rash."      Red rash linear and in patches 2. LOCATION: "Where is the rash located?"      Left chest left arm left side  3. NUMBER: "How many spots are there?"      Na  4. SIZE: "How big are the spots?" (Inches, centimeters or compare to size of a coin)      na 5. ONSET: "When did the rash start?"      1 week ago and worsening  6. ITCHING: "Does the rash itch?" If Yes, ask: "How bad is the itch?"  (Scale 0-10; or none, mild, moderate, severe)     Yes  7. PAIN: "Does the rash hurt?" If Yes, ask: "How bad is the pain?"  (Scale 0-10; or none, mild, moderate, severe)    - NONE (0): no  pain    - MILD (1-3): doesn't interfere with normal activities     - MODERATE (4-7): interferes with normal activities or awakens from sleep     - SEVERE (8-10): excruciating pain, unable to do any normal activities     Keeps awake ,moderate pain  8. OTHER SYMPTOMS: "Do you have any other symptoms?" (e.g., fever)     Burning , itching  9. PREGNANCY: "Is there any chance you are pregnant?" "When was your last menstrual period?"     na  Protocols used: Rash or Redness - Localized-A-AH

## 2024-03-10 ENCOUNTER — Encounter: Payer: Self-pay | Admitting: Family Medicine

## 2024-03-10 ENCOUNTER — Ambulatory Visit: Admitting: Family Medicine

## 2024-03-10 VITALS — BP 126/74 | HR 84 | Resp 16 | Ht 65.0 in | Wt 185.0 lb

## 2024-03-10 DIAGNOSIS — B029 Zoster without complications: Secondary | ICD-10-CM | POA: Diagnosis not present

## 2024-03-10 MED ORDER — VALACYCLOVIR HCL 1 G PO TABS: 1000.0000 mg | ORAL_TABLET | Freq: Three times a day (TID) | ORAL | 0 refills | Status: DC

## 2024-03-10 MED ORDER — GABAPENTIN 100 MG PO CAPS: 100.0000 mg | ORAL_CAPSULE | Freq: Every day | ORAL | 0 refills | Status: DC

## 2024-03-10 MED ORDER — PREDNISONE 20 MG PO TABS: 40.0000 mg | ORAL_TABLET | Freq: Every day | ORAL | 0 refills | Status: AC

## 2024-03-10 MED ORDER — MELOXICAM 7.5 MG PO TABS
7.5000 mg | ORAL_TABLET | Freq: Every day | ORAL | 1 refills | Status: DC
Start: 2024-03-10 — End: 2024-05-06

## 2024-03-17 ENCOUNTER — Encounter: Payer: Self-pay | Admitting: Family Medicine

## 2024-03-17 NOTE — Progress Notes (Signed)
 Patient ID: April Murphy, female    DOB: 02-Mar-1961, 63 y.o.   MRN: [id number]  PCP: Rockney Cid, DO  Chief Complaint  Patient presents with   Rash    Multiple locations- chest, arms, and shoulder all on L side. Itching and burning. No new detergents or lotions.     Subjective:   April Murphy is a 63 y.o. female, presents to clinic with CC of the following:  Rash    Patient present with a itchy and burning rash that started about 2 weeks ago on her left chest and has continued to spread with new rash wrapping around her side and left back The pain and itching has worsened with the new lesions developing and is more severe described as burning  Patient Active Problem List   Diagnosis Date Noted   Screening for colon cancer 11/17/2023   Polyp of cecum 11/17/2023   Polyp of sigmoid colon 11/17/2023   Environmental allergies 03/19/2023   Primary hypertension 09/17/2022   Mixed hyperlipidemia 09/17/2022      Current Outpatient Medications:    cetirizine  (ZYRTEC ) 10 MG tablet, Take 1 tablet (10 mg total) by mouth daily., Disp: 30 tablet, Rfl: 11   fenofibrate  (TRICOR ) 48 MG tablet, Take 1 tablet (48 mg total) by mouth daily., Disp: 90 tablet, Rfl: 1   gabapentin (NEURONTIN) 100 MG capsule, Take 1-2 capsules (100-200 mg total) by mouth at bedtime., Disp: 60 capsule, Rfl: 0   lisinopril -hydrochlorothiazide  (ZESTORETIC ) 20-25 MG tablet, Take 1 tablet by mouth daily., Disp: 90 tablet, Rfl: 1   simvastatin  (ZOCOR ) 10 MG tablet, Take 1 tablet (10 mg total) by mouth at bedtime., Disp: 90 tablet, Rfl: 1   valACYclovir (VALTREX) 1000 MG tablet, Take 1 tablet (1,000 mg total) by mouth 3 (three) times daily., Disp: 21 tablet, Rfl: 0   estradiol  (ESTRACE  VAGINAL) 0.1 MG/GM vaginal cream, Place 1 Applicatorful vaginally at bedtime. (Patient not taking: Reported on 03/10/2024), Disp: 42.5 g, Rfl: 12   meloxicam  (MOBIC ) 7.5 MG tablet, Take 1-2 tablets (7.5-15 mg total) by mouth  daily., Disp: 60 tablet, Rfl: 1   methocarbamol  (ROBAXIN ) 750 MG tablet, Take 1 tablet (750 mg total) by mouth every 8 (eight) hours as needed. (Patient not taking: Reported on 03/10/2024), Disp: 90 tablet, Rfl: 1   No Known Allergies   Social History   Tobacco Use   Smoking status: Never   Smokeless tobacco: Never  Vaping Use   Vaping status: Never Used  Substance Use Topics   Alcohol use: Never      Chart Review Today: I personally reviewed active problem list, medication list, allergies, family history, social history, health maintenance, notes from last encounter, lab results, imaging with the patient/caregiver today.   Review of Systems  Skin:  Positive for rash.  All other systems reviewed and are negative.      Objective:   Vitals:   03/10/24 1546  BP: 126/74  Pulse: 84  Resp: 16  SpO2: 96%  Weight: 185 lb (83.9 kg)  Height: 5\' 5"  (1.651 m)    Body mass index is 30.79 kg/m.  Physical Exam Vitals and nursing note reviewed.  Constitutional:      General: She is not in acute distress.    Appearance: Normal appearance. She is well-developed. She is not ill-appearing, toxic-appearing or diaphoretic.  HENT:     Head: Normocephalic and atraumatic.     Right Ear: External ear normal.     Left Ear: External  ear normal.     Nose: Nose normal.  Eyes:     General: No scleral icterus.       Right eye: No discharge.        Left eye: No discharge.     Conjunctiva/sclera: Conjunctivae normal.  Neck:     Trachea: No tracheal deviation.  Cardiovascular:     Rate and Rhythm: Normal rate.  Pulmonary:     Effort: Pulmonary effort is normal. No respiratory distress.     Breath sounds: No stridor.  Skin:    General: Skin is warm and dry.     Findings: Rash present.     Comments: Erythematous scabbed rash to anterior upper chest wall in clusters that continues around left side to left upper back with more clusters of erythematous vesicles  Neurological:     Mental  Status: She is alert.     Motor: No abnormal muscle tone.     Coordination: Coordination normal.     Gait: Gait normal.  Psychiatric:        Mood and Affect: Mood normal.        Behavior: Behavior normal.      Results for orders placed or performed during the hospital encounter of 11/17/23  Surgical pathology   Collection Time: 11/17/23 12:00 AM  Result Value Ref Range   SURGICAL PATHOLOGY      SURGICAL PATHOLOGY [facility] [address], [address] [place], [place] [address] Telephone [phone] or [phone] Fax [phone]  REPORT OF SURGICAL PATHOLOGY   Accession #: [id number] Patient Name: April Murphy Visit # : [id number]  MRN: [id number] Physician: Ellis Guys DOB/Age [birth year] (Age: 76) Gender: F Collected Date: 11/17/2023 Received Date: 11/17/2023  FINAL DIAGNOSIS       1. Cecum Polyp, Cold snare :       - TUBULAR ADENOMA (MULTIPLE FRAGMENTS)      - NEGATIVE FOR HIGH-GRADE DYSPLASIA OR MALIGNANCY       2. Sigmoid  Colon Polyp, Cold snare :       - INFLAMMATORY POLYP (MULTIPLE FRAGMENTS)      - NEGATIVE FOR MALIGNANCY       ELECTRONIC SIGNATURE : Kanteti M.D., Link Rice., Pathologist, Electronic Signature  MICROSCOPIC DESCRIPTION  CASE COMMENTS STAINS USED IN DIAGNOSIS: H&E H&E    CLINICAL HISTORY  SPECIMEN(S) OBTAINED 1. Cecum Polyp, Cold Snare 2. Sigmoid  Colon Polyp, Cold Snare  SPECIME N COMMENTS: SPECIMEN CLINICAL INFORMATION: 1. Screening for colon caner.Cecal polyp, diverticulosis    Gross Description 1. Received in formalin is a 0.5 x 0.5 x 0.1 cm aggregate of multiple tan polypoid tissue fragments submitted entirely in block 1A. 2. Received in formalin is 0.7 x 0.5 x 0.2 cm aggregate of multiple tan-red polypoid tissue fragments submitted entirely in block 2A.(SB:kh 11/17/23)        Report signed out from the following location(s) [place]  [facility] [address], [place] [address] CLIA #: [id number]  [facility] [address] [place], [place] [address] CLIA #: [id number]        Assessment & Plan:   1. Herpes zoster without complication (Primary) Onset of sx and rash 2 weeks ago but she has continued development of new lesions/vesicles - discussed +/- of late initiation of antiviral meds - and reviewed and researched with pt in exam room - one indication for valtrex after 48-72 hours was with development of new and continued lesions which is  her current situation she is also having new neuropathic pain She does have crusted lesions on her anterior chest wall with no signs of secondary infections Vesicles to side and left upper back Discussed Valtrex, plus minus steroids, and if any prolonged nerve pain have encouraged her to follow-up with her PCP for discussion on postherpetic neuralgia management At this time her pain is not described as terribly severe but it has become much more uncomfortable than the first week of symptoms - valACYclovir (VALTREX) 1000 MG tablet; Take 1 tablet (1,000 mg total) by mouth 3 (three) times daily.  Dispense: 21 tablet; Refill: 0 - predniSONE (DELTASONE) 20 MG tablet; Take 2 tablets (40 mg total) by mouth daily with breakfast for 5 days.  Dispense: 10 tablet; Refill: 0   f/up prn if sx do not improve or if there is any worsening nerve pain  She reported some pain relief with meloxicam  - which was on her chart but she needed refill Did low dose gabapentin which she can try at bedtime if pain becomes severe enough to prevent sleep - recommend PCP f/up for continued management of this if sx are prolonged    Adeline Hone, PA-C 03/17/24 1:17 PM

## 2024-03-22 ENCOUNTER — Other Ambulatory Visit: Payer: Self-pay | Admitting: Internal Medicine

## 2024-03-22 DIAGNOSIS — Z1231 Encounter for screening mammogram for malignant neoplasm of breast: Secondary | ICD-10-CM

## 2024-03-23 ENCOUNTER — Telehealth: Admitting: Internal Medicine

## 2024-03-24 ENCOUNTER — Other Ambulatory Visit: Payer: Self-pay | Admitting: Internal Medicine

## 2024-03-24 DIAGNOSIS — E782 Mixed hyperlipidemia: Secondary | ICD-10-CM

## 2024-03-24 NOTE — Progress Notes (Unsigned)
 Name: April Murphy   MRN: [id number]    DOB: [birth year]   Date:03/25/2024       Progress Note  Subjective  Chief Complaint  Chief Complaint  Patient presents with   Annual Exam    HPI  Patient presents for annual CPE.  Diet: Regular - trying to increase fruit, cooks at home  Exercise: 4 days 15 minutes - riding bike, starting to walk  Last Eye Exam: completed Last Dental Exam: completed  Flowsheet Row Office Visit from 03/25/2024 in [facility]  AUDIT-C Score 0      Depression: Phq 9 is  negative    03/25/2024    9:00 AM 10/06/2023   11:29 AM 09/24/2023    8:33 AM 03/19/2023   11:04 AM 10/18/2022    2:55 PM  Depression screen PHQ 2/9  Decreased Interest 0 0 0 0 0  Down, Depressed, Hopeless 0 0 0 0 0  PHQ - 2 Score 0 0 0 0 0  Altered sleeping    0 0  Tired, decreased energy    0 0  Change in appetite    0 0  Feeling bad or failure about yourself     0 0  Trouble concentrating    0 0  Moving slowly or fidgety/restless    0 0  Suicidal thoughts    0 0  PHQ-9 Score    0 0  Difficult doing work/chores    Not difficult at all    Hypertension: BP Readings from Last 3 Encounters:  03/10/24 126/74  11/17/23 117/79  10/06/23 122/84   Obesity: Wt Readings from Last 3 Encounters:  03/25/24 185 lb 9.6 oz (84.2 kg)  03/10/24 185 lb (83.9 kg)  11/17/23 182 lb 9.6 oz (82.8 kg)   BMI Readings from Last 3 Encounters:  03/25/24 30.89 kg/m  03/10/24 30.79 kg/m  11/17/23 30.39 kg/m     Vaccines: reviewed with the patient. Prevnar 20 administered today, discussed obtaining the 2 dose Shingrix vaccine at the pharmacy.  Hep C Screening: completed STD testing and prevention (HIV/chl/gon/syphilis): no concerns  Intimate partner violence: negative screen  Sexual History :active Menstrual History/LMP/Abnormal Bleeding: Menopausal age at 22, no issues  Discussed importance of follow up if any post-menopausal bleeding: no  Incontinence Symptoms:  negative for symptoms   Breast cancer:  - Last Mammogram: 04/02/2023, scheduled for later this month  Osteoporosis Prevention : Discussed high calcium and vitamin D supplementation, weight bearing exercises Bone density :5 years ago  Cervical cancer screening: performing today  Skin cancer: Discussed monitoring for atypical lesions  Colorectal cancer: colonoscopy 1/25, repeat in 7 years Lung cancer:  Low Dose CT Chest recommended if Age 46-80 years, 20 pack-year currently smoking OR have quit w/in 15years. Patient does not qualify for screen    Advanced Care Planning: A voluntary discussion about advance care planning including the explanation and discussion of advance directives.  Discussed health care proxy and Living will, and the patient was able to identify a health care proxy as Marygrace Snellen (husband).  Patient does not have a living will and power of attorney of health care   Patient Active Problem List   Diagnosis Date Noted   Screening for colon cancer 11/17/2023   Polyp of cecum 11/17/2023   Polyp of sigmoid colon 11/17/2023   Environmental allergies 03/19/2023   Primary hypertension 09/17/2022   Mixed hyperlipidemia 09/17/2022    Past Surgical History:  Procedure Laterality Date   BREAST  BIOPSY     Since early 20s per pt   BREAST SURGERY     CESAREAN SECTION     CHOLECYSTECTOMY     COLONOSCOPY WITH PROPOFOL  N/A 11/17/2023   Procedure: COLONOSCOPY WITH PROPOFOL ;  Surgeon: Selena Daily, MD;  Location: [facility] ENDOSCOPY;  Service: Gastroenterology;  Laterality: N/A;   POLYPECTOMY  11/17/2023   Procedure: POLYPECTOMY;  Surgeon: Selena Daily, MD;  Location: [facility] ENDOSCOPY;  Service: Gastroenterology;;    Family History  Problem Relation Age of Onset   Hypertension Father    Hypertension Sister    Hyperlipidemia Sister    Hypertension Brother    Hypertension Son    Breast cancer Other     Social History   Socioeconomic History   Marital status: Married     Spouse name: Not on file   Number of children: Not on file   Years of education: Not on file   Highest education level: 12th grade  Occupational History   Not on file  Tobacco Use   Smoking status: Never   Smokeless tobacco: Never  Vaping Use   Vaping status: Never Used  Substance and Sexual Activity   Alcohol use: Never   Drug use: Not on file   Sexual activity: Yes  Other Topics Concern   Not on file  Social History Narrative   Not on file   Social Drivers of Health   Financial Resource Strain: Low Risk  (03/25/2024)   Overall Financial Resource Strain (CARDIA)    Difficulty of Paying Living Expenses: Not hard at all  Food Insecurity: No Food Insecurity (03/25/2024)   Hunger Vital Sign    Worried About Running Out of Food in the Last Year: Never true    Ran Out of Food in the Last Year: Never true  Transportation Needs: No Transportation Needs (03/25/2024)   PRAPARE - Administrator, Civil Service (Medical): No    Lack of Transportation (Non-Medical): No  Physical Activity: Insufficiently Active (03/25/2024)   Exercise Vital Sign    Days of Exercise per Week: 4 days    Minutes of Exercise per Session: 20 min  Stress: No Stress Concern Present (03/25/2024)   [employer] of Occupational Health - Occupational Stress Questionnaire    Feeling of Stress : Not at all  Social Connections: Socially Integrated (03/25/2024)   Social Connection and Isolation Panel [NHANES]    Frequency of Communication with Friends and Family: More than three times a week    Frequency of Social Gatherings with Friends and Family: More than three times a week    Attends Religious Services: More than 4 times per year    Active Member of [employer] or Organizations: Yes    Attends Engineer, structural: More than 4 times per year    Marital Status: Married  Catering manager Violence: Not At Risk (03/25/2024)   Humiliation, Afraid, Rape, and Kick questionnaire    Fear of Current or  Ex-Partner: No    Emotionally Abused: No    Physically Abused: No    Sexually Abused: No     Current Outpatient Medications:    cetirizine  (ZYRTEC ) 10 MG tablet, Take 1 tablet (10 mg total) by mouth daily., Disp: 30 tablet, Rfl: 11   fenofibrate  (TRICOR ) 48 MG tablet, Take 1 tablet (48 mg total) by mouth daily., Disp: 90 tablet, Rfl: 1   gabapentin  (NEURONTIN ) 100 MG capsule, Take 1-2 capsules (100-200 mg total) by mouth at bedtime., Disp: 60 capsule,  Rfl: 0   lisinopril -hydrochlorothiazide  (ZESTORETIC ) 20-25 MG tablet, Take 1 tablet by mouth daily., Disp: 90 tablet, Rfl: 1   meloxicam  (MOBIC ) 7.5 MG tablet, Take 1-2 tablets (7.5-15 mg total) by mouth daily., Disp: 60 tablet, Rfl: 1   simvastatin  (ZOCOR ) 10 MG tablet, Take 1 tablet (10 mg total) by mouth at bedtime., Disp: 90 tablet, Rfl: 1   valACYclovir  (VALTREX ) 1000 MG tablet, Take 1 tablet (1,000 mg total) by mouth 3 (three) times daily., Disp: 21 tablet, Rfl: 0   estradiol  (ESTRACE  VAGINAL) 0.1 MG/GM vaginal cream, Place 1 Applicatorful vaginally at bedtime. (Patient not taking: Reported on 03/25/2024), Disp: 42.5 g, Rfl: 12   methocarbamol  (ROBAXIN ) 750 MG tablet, Take 1 tablet (750 mg total) by mouth every 8 (eight) hours as needed. (Patient not taking: Reported on 03/25/2024), Disp: 90 tablet, Rfl: 1  No Known Allergies   Review of Systems  All other systems reviewed and are negative.    Objective  Vitals:   03/25/24 0857  Pulse: 94  Resp: 16  Temp: 98.2 F (36.8 C)  TempSrc: Oral  SpO2: 98%  Weight: 185 lb 9.6 oz (84.2 kg)  Height: 5\' 5"  (1.651 m)    Body mass index is 30.89 kg/m.  Physical Exam Exam conducted with a chaperone present.  Constitutional:      Appearance: Normal appearance.  HENT:     Head: Normocephalic and atraumatic.  Eyes:     Conjunctiva/sclera: Conjunctivae normal.  Cardiovascular:     Rate and Rhythm: Normal rate and regular rhythm.  Pulmonary:     Effort: Pulmonary effort is normal.      Breath sounds: Normal breath sounds.  Chest:  Breasts:    Right: Normal.     Left: Normal.  Genitourinary:    Comments: External genitalia within normal limits.  Vaginal mucosa pink, moist, normal rugae.  Nonfriable cervix with one small polyp-like lesion but no discharge or bleeding noted on speculum exam.  Bimanual exam revealed normal, nongravid uterus.  No cervical motion tenderness. No adnexal masses bilaterally.    Lymphadenopathy:     Upper Body:     Right upper body: No supraclavicular, axillary or pectoral adenopathy.     Left upper body: No supraclavicular, axillary or pectoral adenopathy.  Skin:    General: Skin is warm and dry.  Neurological:     General: No focal deficit present.     Mental Status: She is alert. Mental status is at baseline.  Psychiatric:        Mood and Affect: Mood normal.        Behavior: Behavior normal.     Last CBC Lab Results  Component Value Date   WBC 5.6 03/20/2023   HGB 12.7 03/20/2023   HCT 37.7 03/20/2023   MCV 81.4 03/20/2023   MCH 27.4 03/20/2023   RDW 14.1 03/20/2023   PLT 181 03/20/2023   Last metabolic panel Lab Results  Component Value Date   GLUCOSE 116 (H) 03/20/2023   NA 140 03/20/2023   K 4.7 03/20/2023   CL 103 03/20/2023   CO2 30 03/20/2023   BUN 25 03/20/2023   CREATININE 0.71 03/20/2023   EGFR 95 03/20/2023   CALCIUM 9.4 03/20/2023   PROT 6.6 03/20/2023   BILITOT 0.4 03/20/2023   AST 17 03/20/2023   ALT 22 03/20/2023   Last lipids Lab Results  Component Value Date   CHOL 158 03/20/2023   HDL 30 (L) 03/20/2023   LDLCALC 83 03/20/2023  TRIG 373 (H) 03/20/2023   CHOLHDL 5.3 (H) 03/20/2023   Last hemoglobin A1c No results found for: "HGBA1C" Last thyroid functions No results found for: "TSH", "T3TOTAL", "T4TOTAL", "THYROIDAB" Last vitamin D No results found for: "25OHVITD2", "25OHVITD3", "VD25OH" Last vitamin B12 and Folate No results found for: "VITAMINB12", "FOLATE"    Assessment &  Plan  1. Annual physical exam (Primary)/Mixed hyperlipidemia: Physical exam completed, health maintenance reviewed and annual labs ordered.   - CBC w/Diff/Platelet - Comprehensive Metabolic Panel (CMET) - Lipid Profile   2. Cervical cancer screening: Pap performed today.  - Cytology - PAP  3. Vaccine for streptococcus pneumoniae and influenza: Prevnar 20 administered.   - Pneumococcal conjugate vaccine 20-valent (Prevnar 20)  -USPSTF grade A and B recommendations reviewed with patient; age-appropriate recommendations, preventive care, screening tests, etc discussed and encouraged; healthy living encouraged; see AVS for patient education given to patient -Discussed importance of 150 minutes of physical activity weekly, eat two servings of fish weekly, eat one serving of tree nuts ( cashews, pistachios, pecans, almonds.Aaron Aas) every other day, eat 6 servings of fruit/vegetables daily and drink plenty of water and avoid sweet beverages.   -Reviewed Health Maintenance: Yes.

## 2024-03-25 ENCOUNTER — Encounter: Payer: Self-pay | Admitting: Internal Medicine

## 2024-03-25 ENCOUNTER — Other Ambulatory Visit: Payer: Self-pay

## 2024-03-25 ENCOUNTER — Ambulatory Visit: Payer: 59 | Admitting: Internal Medicine

## 2024-03-25 ENCOUNTER — Other Ambulatory Visit (HOSPITAL_COMMUNITY)
Admission: RE | Admit: 2024-03-25 | Discharge: 2024-03-25 | Disposition: A | Discharge status: Home / Self Care | Source: Ambulatory Visit | Attending: Internal Medicine | Admitting: Internal Medicine

## 2024-03-25 VITALS — BP 124/72 | HR 94 | Temp 98.2°F | Resp 16 | Ht 65.0 in | Wt 185.6 lb

## 2024-03-25 DIAGNOSIS — Z0000 Encounter for general adult medical examination without abnormal findings: Secondary | ICD-10-CM

## 2024-03-25 DIAGNOSIS — Z124 Encounter for screening for malignant neoplasm of cervix: Secondary | ICD-10-CM | POA: Diagnosis present

## 2024-03-25 DIAGNOSIS — Z23 Encounter for immunization: Secondary | ICD-10-CM

## 2024-03-25 DIAGNOSIS — E782 Mixed hyperlipidemia: Secondary | ICD-10-CM

## 2024-03-25 DIAGNOSIS — Z0001 Encounter for general adult medical examination with abnormal findings: Secondary | ICD-10-CM

## 2024-03-25 NOTE — Telephone Encounter (Signed)
 Has not had a regular follow up only CPE today

## 2024-03-25 NOTE — Telephone Encounter (Signed)
 Requested medications are due for refill today.  yes  Requested medications are on the active medications list.  yes  Last refill. 09/24/2023 #90 1 rf  Future visit scheduled.   Yes - today  Notes to clinic.  Labs are expired.    Requested Prescriptions  Pending Prescriptions Disp Refills   simvastatin  (ZOCOR ) 10 MG tablet [Pharmacy Med Name: SIMVASTATIN  10 MG TABLET] 90 tablet 1    Sig: TAKE 1 TABLET BY MOUTH EVERYDAY AT BEDTIME     Cardiovascular:  Antilipid - Statins Failed - 03/25/2024 10:05 AM      Failed - Lipid Panel in normal range within the last 12 months    Cholesterol  Date Value Ref Range Status  03/20/2023 158 <200 mg/dL Final   LDL Cholesterol (Calc)  Date Value Ref Range Status  03/20/2023 83 mg/dL (calc) Final    Comment:    Reference range: <100 . Desirable range <100 mg/dL for primary prevention;   <70 mg/dL for patients with CHD or diabetic patients  with > or = 2 CHD risk factors. Aaron Aas LDL-C is now calculated using the Martin-Hopkins  calculation, which is a validated novel method providing  better accuracy than the Friedewald equation in the  estimation of LDL-C.  Melinda Sprawls et al. Erroll Heard. 1610;960(45): 2061-2068  (http://education.[facility].com/faq/FAQ164)    HDL  Date Value Ref Range Status  03/20/2023 30 (L) > OR = 50 mg/dL Final   Triglycerides  Date Value Ref Range Status  03/20/2023 373 (H) <150 mg/dL Final    Comment:    . If a non-fasting specimen was collected, consider repeat triglyceride testing on a fasting specimen if clinically indicated.  Imagene Mam et al. J. of Clin. Lipidol. 2015;9:129-169. Aaron Aas          Passed - Patient is not pregnant      Passed - Valid encounter within last 12 months    Recent Outpatient Visits           Today Annual physical exam   [facility] Rockney Cid, DO   2 weeks ago Herpes zoster without complication   [facility] Adeline Hone,  PA-C       Future Appointments             In 6 months Rockney Cid, DO [facility], [facility]

## 2024-03-29 LAB — CYTOLOGY - PAP
Adequacy: ABSENT
Comment: NEGATIVE
Diagnosis: NEGATIVE
High risk HPV: NEGATIVE

## 2024-03-30 ENCOUNTER — Ambulatory Visit: Payer: Self-pay | Admitting: Internal Medicine

## 2024-03-30 DIAGNOSIS — R7309 Other abnormal glucose: Secondary | ICD-10-CM

## 2024-03-30 DIAGNOSIS — E781 Pure hyperglyceridemia: Secondary | ICD-10-CM

## 2024-04-02 ENCOUNTER — Ambulatory Visit
Admission: RE | Admit: 2024-04-02 | Discharge: 2024-04-02 | Disposition: A | Discharge status: Home / Self Care | Source: Ambulatory Visit | Attending: Internal Medicine

## 2024-04-02 DIAGNOSIS — Z1231 Encounter for screening mammogram for malignant neoplasm of breast: Secondary | ICD-10-CM | POA: Insufficient documentation

## 2024-04-05 LAB — COMPREHENSIVE METABOLIC PANEL WITH GFR
AG Ratio: 1.7 (calc) (ref 1.0–2.5)
ALT: 19 U/L (ref 6–29)
AST: 16 U/L (ref 10–35)
Albumin: 4.5 g/dL (ref 3.6–5.1)
Alkaline phosphatase (APISO): 63 U/L (ref 37–153)
BUN/Creatinine Ratio: 29 (calc) — ABNORMAL HIGH (ref 6–22)
BUN: 27 mg/dL — ABNORMAL HIGH (ref 7–25)
CO2: 27 mmol/L (ref 20–32)
Calcium: 9.4 mg/dL (ref 8.6–10.4)
Chloride: 104 mmol/L (ref 98–110)
Creat: 0.92 mg/dL (ref 0.50–1.05)
Globulin: 2.7 g/dL (ref 1.9–3.7)
Glucose, Bld: 121 mg/dL — ABNORMAL HIGH (ref 65–99)
Potassium: 4.5 mmol/L (ref 3.5–5.3)
Sodium: 140 mmol/L (ref 135–146)
Total Bilirubin: 0.4 mg/dL (ref 0.2–1.2)
Total Protein: 7.2 g/dL (ref 6.1–8.1)
eGFR: 70 mL/min/{1.73_m2} (ref >=60)

## 2024-04-05 LAB — LIPID PANEL
Cholesterol: 160 mg/dL (ref <200)
HDL: 30 mg/dL — ABNORMAL LOW (ref >=50)
LDL Cholesterol (Calc): 90 mg/dL
Non-HDL Cholesterol (Calc): 130 mg/dL — ABNORMAL HIGH (ref <130)
Total CHOL/HDL Ratio: 5.3 (calc) — ABNORMAL HIGH (ref <5.0)
Triglycerides: 302 mg/dL — ABNORMAL HIGH (ref <150)

## 2024-04-05 LAB — CBC WITH DIFFERENTIAL/PLATELET
Absolute Lymphocytes: 2228 {cells}/uL (ref 850–3900)
Absolute Monocytes: 380 {cells}/uL (ref 200–950)
Basophils Absolute: 88 {cells}/uL (ref 0–200)
Basophils Relative: 1.6 %
Eosinophils Absolute: 242 {cells}/uL (ref 15–500)
Eosinophils Relative: 4.4 %
HCT: 40.4 % (ref 35.0–45.0)
Hemoglobin: 13 g/dL (ref 11.7–15.5)
MCH: 27.3 pg (ref 27.0–33.0)
MCHC: 32.2 g/dL (ref 32.0–36.0)
MCV: 84.9 fL (ref 80.0–100.0)
MPV: 12.3 fL (ref 7.5–12.5)
Monocytes Relative: 6.9 %
Neutro Abs: 2563 {cells}/uL (ref 1500–7800)
Neutrophils Relative %: 46.6 %
Platelets: 183 10*3/uL (ref 140–400)
RBC: 4.76 10*6/uL (ref 3.80–5.10)
RDW: 14.4 % (ref 11.0–15.0)
Total Lymphocyte: 40.5 %
WBC: 5.5 10*3/uL (ref 3.8–10.8)

## 2024-04-05 LAB — HEMOGLOBIN A1C
Hgb A1c MFr Bld: 5.9 % — ABNORMAL HIGH (ref <5.7)
Mean Plasma Glucose: 123 mg/dL
eAG (mmol/L): 6.8 mmol/L

## 2024-04-06 ENCOUNTER — Ambulatory Visit: Payer: Self-pay | Admitting: Internal Medicine

## 2024-04-06 MED ORDER — FENOFIBRATE 145 MG PO TABS: 145.0000 mg | ORAL_TABLET | Freq: Every day | ORAL | 3 refills | Status: AC

## 2024-05-05 ENCOUNTER — Other Ambulatory Visit: Payer: Self-pay | Admitting: Family Medicine

## 2024-05-06 NOTE — Telephone Encounter (Signed)
 Requested Prescriptions  Pending Prescriptions Disp Refills   meloxicam  (MOBIC ) 7.5 MG tablet [Pharmacy Med Name: MELOXICAM  7.5 MG TABLET] 180 tablet 1    Sig: TAKE 1-2 TABLETS BY MOUTH DAILY.     Analgesics:  COX2 Inhibitors Failed - 05/06/2024 12:32 PM      Failed - Manual Review: Labs are only required if the patient has taken medication for more than 8 weeks.      Passed - HGB in normal range and within 360 days    Hemoglobin  Date Value Ref Range Status  04/02/2024 13.0 11.7 - 15.5 g/dL Final         Passed - Cr in normal range and within 360 days    Creat  Date Value Ref Range Status  04/02/2024 0.92 0.50 - 1.05 mg/dL Final         Passed - HCT in normal range and within 360 days    HCT  Date Value Ref Range Status  04/02/2024 40.4 35.0 - 45.0 % Final         Passed - AST in normal range and within 360 days    AST  Date Value Ref Range Status  04/02/2024 16 10 - 35 U/L Final         Passed - ALT in normal range and within 360 days    ALT  Date Value Ref Range Status  04/02/2024 19 6 - 29 U/L Final         Passed - eGFR is 30 or above and within 360 days    eGFR  Date Value Ref Range Status  04/02/2024 70 > OR = 60 mL/min/1.70m2 Final         Passed - Patient is not pregnant      Passed - Valid encounter within last 12 months    Recent Outpatient Visits           1 month ago Annual physical exam   [place] Bernardo Fend, DO   1 month ago Herpes zoster without complication   [facility] Leavy Mole, PA-C       Future Appointments             In 4 months Bernardo Fend, DO [facility], [facility]

## 2024-06-19 ENCOUNTER — Other Ambulatory Visit: Payer: Self-pay | Admitting: Internal Medicine

## 2024-06-19 DIAGNOSIS — I10 Essential (primary) hypertension: Secondary | ICD-10-CM

## 2024-06-21 NOTE — Telephone Encounter (Signed)
 Requested Prescriptions  Pending Prescriptions Disp Refills   lisinopril -hydrochlorothiazide  (ZESTORETIC ) 20-25 MG tablet [Pharmacy Med Name: LISINOPRIL -HCTZ 20-25 MG TAB] 90 tablet 1    Sig: TAKE 1 TABLET BY MOUTH EVERY DAY     Cardiovascular:  ACEI + Diuretic Combos Passed - 06/21/2024 11:57 AM      Passed - Na in normal range and within 180 days    Sodium  Date Value Ref Range Status  04/02/2024 140 135 - 146 mmol/L Final         Passed - K in normal range and within 180 days    Potassium  Date Value Ref Range Status  04/02/2024 4.5 3.5 - 5.3 mmol/L Final         Passed - Cr in normal range and within 180 days    Creat  Date Value Ref Range Status  04/02/2024 0.92 0.50 - 1.05 mg/dL Final         Passed - eGFR is 30 or above and within 180 days    eGFR  Date Value Ref Range Status  04/02/2024 70 > OR = 60 mL/min/1.20m2 Final         Passed - Patient is not pregnant      Passed - Last BP in normal range    BP Readings from Last 1 Encounters:  03/25/24 124/72         Passed - Valid encounter within last 6 months    Recent Outpatient Visits           2 months ago Annual physical exam   [facility] Bernardo Fend, DO   3 months ago Herpes zoster without complication   [facility] Leavy Mole, PA-C       Future Appointments             In 3 months Bernardo Fend, DO [facility], [place]

## 2024-09-24 ENCOUNTER — Other Ambulatory Visit: Payer: Self-pay

## 2024-09-24 ENCOUNTER — Encounter: Payer: Self-pay | Admitting: Internal Medicine

## 2024-09-24 ENCOUNTER — Ambulatory Visit: Admitting: Internal Medicine

## 2024-09-24 VITALS — BP 118/74 | HR 97 | Temp 98.2°F | Resp 16 | Ht 65.0 in | Wt 183.1 lb

## 2024-09-24 DIAGNOSIS — R7303 Prediabetes: Secondary | ICD-10-CM | POA: Diagnosis not present

## 2024-09-24 DIAGNOSIS — I10 Essential (primary) hypertension: Secondary | ICD-10-CM

## 2024-09-24 DIAGNOSIS — E782 Mixed hyperlipidemia: Secondary | ICD-10-CM | POA: Diagnosis not present

## 2024-09-24 DIAGNOSIS — M170 Bilateral primary osteoarthritis of knee: Secondary | ICD-10-CM | POA: Diagnosis not present

## 2024-09-24 LAB — POCT GLYCOSYLATED HEMOGLOBIN (HGB A1C): Hemoglobin A1C: 5.7 % — AB (ref 4.0–5.6)

## 2024-09-24 MED ORDER — CELECOXIB 50 MG PO CAPS: 50.0000 mg | ORAL_CAPSULE | Freq: Two times a day (BID) | ORAL | 1 refills | Status: AC | PRN

## 2024-09-24 MED ORDER — SIMVASTATIN 10 MG PO TABS: 10.0000 mg | ORAL_TABLET | Freq: Every day | ORAL | 1 refills | Status: AC

## 2024-09-24 NOTE — Progress Notes (Signed)
 Established Patient Office Visit  Subjective    Patient ID: April Murphy, female    DOB: [birth year]  Age: 63 y.o. MRN: [id number]  CC:  Chief Complaint  Patient presents with   Medical Management of Chronic Issues    6 month recheck    HPI Edward D Kunath presents to follow up on chronic medical conditions.   Discussed the use of AI scribe software for clinical note transcription with the patient, who gave verbal consent to proceed.  History of Present Illness April Murphy is a 63 year old female who presents for follow-up on her A1c levels and management of knee pain.  Her A1c decreased from 5.9 in June to 5.7 today, still in the prediabetic range. She uses stevia and monk fruit to limit sugar intake. She is on a higher dose of fenofibrate  for prior triglycerides of 373.  She has knee pain from arthritis with prior orthopedic imaging showing moderate to severe tricompartmental disease, worst laterally with joint space narrowing and osteophytes. She has morning stiffness and takes meloxicam  once daily with good relief. She needs reliable pain control to care for her grandchildren three days a week.  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 She takes lisinopril  with blood pressure well controlled at 118/74 and simvastatin  for cholesterol.  She is not taking gabapentin , Robaxin , or Valtrex . She has received pneumonia, tetanus, and whooping cough vaccines but declines the flu shot.   Hypertension: -Medications: Lisinopril -HCTZ 20-25 mg -Patient is compliant with above medications and reports no side effects. -Checking BP at home (average): doesn't check  -Denies any SOB, CP, vision changes, LE edema or symptoms of hypotension  HLD: -Medications: Zocor  10 mg, increased Tricor  to 145 mg since last labs, taking without issue -Patient is compliant with above medications and reports no side effects.  -Last lipid panel: Lipid Panel     Component Value  Date/Time   CHOL 160 04/02/2024 0808   TRIG 302 (H) 04/02/2024 0808   HDL 30 (L) 04/02/2024 0808   CHOLHDL 5.3 (H) 04/02/2024 0808   LDLCALC 90 04/02/2024 0808   Pre-Diabetes: -Last A1c 6/25 5.9% -Not currently on medications   Vaginal dryness: -Worse with sexual activity after menopause -Was prescribed vaginal estrogen cream previously, still having symptoms but not using regularly   Health Maintenance: -Blood work UTD -Mammogram UTD 6/25 Birads-1 -Colonoscopy 1/25, repeat in 7 years -Pap 6/25  Outpatient Encounter Medications as of 09/24/2024  Medication Sig   cetirizine  (ZYRTEC ) 10 MG tablet Take 1 tablet (10 mg total) by mouth daily.   estradiol  (ESTRACE  VAGINAL) 0.1 MG/GM vaginal cream Place 1 Applicatorful vaginally at bedtime.   fenofibrate  (TRICOR ) 145 MG tablet Take 1 tablet (145 mg total) by mouth daily.   gabapentin  (NEURONTIN ) 100 MG capsule Take 1-2 capsules (100-200 mg total) by mouth at bedtime.   lisinopril -hydrochlorothiazide  (ZESTORETIC ) 20-25 MG tablet TAKE 1 TABLET BY MOUTH EVERY DAY   simvastatin  (ZOCOR ) 10 MG tablet TAKE 1 TABLET BY MOUTH EVERYDAY AT BEDTIME   valACYclovir  (VALTREX ) 1000 MG tablet Take 1 tablet (1,000 mg total) by mouth 3 (three) times daily.   meloxicam  (MOBIC ) 7.5 MG tablet TAKE 1-2 TABLETS BY MOUTH DAILY. (Patient not taking: Reported on 09/24/2024)   methocarbamol  (ROBAXIN ) 750 MG tablet Take 1 tablet (750 mg total) by mouth every 8 (eight) hours as needed. (Patient not taking: Reported on 03/25/2024)   No facility-administered encounter medications on file as of 09/24/2024.  Past Medical History:  Diagnosis Date   Hyperlipidemia    Hypertension     Past Surgical History:  Procedure Laterality Date   BREAST BIOPSY     Since early 2s per pt   BREAST SURGERY     CESAREAN SECTION     CHOLECYSTECTOMY     COLONOSCOPY WITH PROPOFOL  N/A 11/17/2023   Procedure: COLONOSCOPY WITH PROPOFOL ;  Surgeon: Unk Corinn Skiff, MD;  Location:  [facility] ENDOSCOPY;  Service: Gastroenterology;  Laterality: N/A;   POLYPECTOMY  11/17/2023   Procedure: POLYPECTOMY;  Surgeon: Unk Corinn Skiff, MD;  Location: [facility] ENDOSCOPY;  Service: Gastroenterology;;    Family History  Problem Relation Age of Onset   Hypertension Father    Hypertension Sister    Hyperlipidemia Sister    Hypertension Brother    Hypertension Son    Breast cancer Other     Social History   Socioeconomic History   Marital status: Married    Spouse name: Not on file   Number of children: Not on file   Years of education: Not on file   Highest education level: 12th grade  Occupational History   Not on file  Tobacco Use   Smoking status: Never   Smokeless tobacco: Never  Vaping Use   Vaping status: Never Used  Substance and Sexual Activity   Alcohol use: Never   Drug use: Not on file   Sexual activity: Yes  Other Topics Concern   Not on file  Social History Narrative   Not on file   Social Drivers of Health   Financial Resource Strain: Low Risk  (08/26/2024)   Received from [facility]   Overall Financial Resource Strain (CARDIA)    Difficulty of Paying Living Expenses: Not hard at all  Food Insecurity: No Food Insecurity (08/26/2024)   Received from [facility]   Hunger Vital Sign    Within the past 12 months, you worried that your food would run out before you got the money to buy more.: Never true    Within the past 12 months, the food you bought just didn't last and you didn't have money to get more.: Never true  Transportation Needs: No Transportation Needs (08/26/2024)   Received from [facility] - Transportation    In the past 12 months, has lack of transportation kept you from medical appointments or from getting medications?: No    Lack of Transportation (Non-Medical): No  Physical Activity: Insufficiently Active (03/25/2024)   Exercise Vital Sign    Days of Exercise per Week: 4  days    Minutes of Exercise per Session: 20 min  Stress: No Stress Concern Present (03/25/2024)   [employer] - Occupational Stress Questionnaire    Feeling of Stress : Not at all  Social Connections: Socially Integrated (03/25/2024)   Social Connection and Isolation Panel    Frequency of Communication with Friends and Family: More than three times a week    Frequency of Social Gatherings with Friends and Family: More than three times a week    Attends Religious Services: More than 4 times per year    Active Member of [employer] or Organizations: Yes    Attends Banker Meetings: More than 4 times per year    Marital Status: Married  Catering Manager Violence: Not At Risk (03/25/2024)   Humiliation, Afraid, Rape, and Kick questionnaire    Fear of Current or Ex-Partner: No  Emotionally Abused: No    Physically Abused: No    Sexually Abused: No    Review of Systems  Musculoskeletal:  Positive for joint pain.       Objective    BP 118/74 (Cuff Size: Large)   Pulse 97   Temp 98.2 F (36.8 C) (Oral)   Resp 16   Ht 5' 5 (1.651 m)   Wt 183 lb 1.6 oz (83.1 kg)   SpO2 96%   BMI 30.47 kg/m   Physical Exam Constitutional:      Appearance: Normal appearance.  HENT:     Head: Normocephalic and atraumatic.     Mouth/Throat:     Mouth: Mucous membranes are moist.     Pharynx: Oropharynx is clear.  Eyes:     Extraocular Movements: Extraocular movements intact.     Conjunctiva/sclera: Conjunctivae normal.     Pupils: Pupils are equal, round, and reactive to light.  Neck:     Comments: No thyromegaly Cardiovascular:     Rate and Rhythm: Normal rate and regular rhythm.  Pulmonary:     Effort: Pulmonary effort is normal.     Breath sounds: Normal breath sounds.  Musculoskeletal:     Cervical back: No tenderness.     Right lower leg: No edema.     Left lower leg: No edema.  Lymphadenopathy:     Cervical: No cervical adenopathy.  Skin:     General: Skin is warm and dry.  Neurological:     General: No focal deficit present.     Mental Status: She is alert. Mental status is at baseline.  Psychiatric:        Mood and Affect: Mood normal.        Behavior: Behavior normal.         Assessment & Plan:   Assessment & Plan Primary osteoarthritis of both knees Moderate to severe tricompartmental arthritis, worse in lateral compartment. Considering Celebrex  for better safety profile. Steroid injections discussed for significant pain. - Switched to Celebrex  50 mg once daily, option for second dose if needed. - Instructed to take Celebrex  with food. - Monitor response to Celebrex , adjust dosage if necessary. - Consider steroid injections if pain becomes significant.  Prediabetes A1c improved from 5.9% to 5.7%, indicating stable prediabetes. Emphasized dietary management to prevent diabetes progression. - Continue monitoring A1c every six months. - Advised on dietary modifications to reduce sugar and carbohydrate intake.  Mixed hyperlipidemia Triglycerides improved with fenofibrate  and simvastatin . Emphasized managing triglycerides and A1c together. - Continue fenofibrate  and simvastatin . - Recheck lipid panel at next visit.  Hypertension - Blood pressure stable here today, no changes made to medications and appropriate refills sent to pharmacy.   General health maintenance Up to date on cancer screenings and vaccinations. Discussed flu and shingles vaccinations. - Continue routine health maintenance and screenings. - Offered flu vaccination annually.  - celecoxib  (CELEBREX ) 50 MG capsule; Take 1 capsule (50 mg total) by mouth 2 (two) times daily as needed for pain.  Dispense: 180 capsule; Refill: 1 - simvastatin  (ZOCOR ) 10 MG tablet; Take 1 tablet (10 mg total) by mouth daily.  Dispense: 90 tablet; Refill: 1 - POCT HgB A1C  Return in about 6 months (around 03/25/2025).   Sharyle Fischer, DO

## 2025-03-25 ENCOUNTER — Ambulatory Visit: Admitting: Internal Medicine
